# Patient Record
Sex: Male | Born: 1980 | Race: White | Hispanic: No | Marital: Married | Smoking: Current every day smoker
Health system: Southern US, Community
[De-identification: ages and names within clinical notes are randomized; demographics above are authoritative.]

---
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
## 2009-02-12 ENCOUNTER — Ambulatory Visit: Payer: Self-pay | Admitting: Diagnostic Radiology

## 2009-02-12 ENCOUNTER — Emergency Department (HOSPITAL_BASED_OUTPATIENT_CLINIC_OR_DEPARTMENT_OTHER): Admission: EM | Admit: 2009-02-12 | Discharge: 2009-02-12 | Payer: Self-pay | Admitting: Emergency Medicine

## 2009-02-14 ENCOUNTER — Emergency Department (HOSPITAL_BASED_OUTPATIENT_CLINIC_OR_DEPARTMENT_OTHER): Admission: EM | Admit: 2009-02-14 | Discharge: 2009-02-14 | Payer: Self-pay | Admitting: Emergency Medicine

## 2009-10-01 ENCOUNTER — Emergency Department (HOSPITAL_BASED_OUTPATIENT_CLINIC_OR_DEPARTMENT_OTHER): Admission: EM | Admit: 2009-10-01 | Discharge: 2009-10-01 | Payer: Self-pay | Admitting: Emergency Medicine

## 2010-06-14 LAB — MONONUCLEOSIS SCREEN: Mono Screen: NEGATIVE

## 2010-06-14 LAB — RAPID STREP SCREEN (MED CTR MEBANE ONLY): Streptococcus, Group A Screen (Direct): NEGATIVE

## 2010-11-18 ENCOUNTER — Emergency Department (HOSPITAL_BASED_OUTPATIENT_CLINIC_OR_DEPARTMENT_OTHER)
Admission: EM | Admit: 2010-11-18 | Discharge: 2010-11-18 | Disposition: A | Discharge status: Home / Self Care | Payer: BC Managed Care – PPO | Attending: Emergency Medicine | Admitting: Emergency Medicine

## 2010-11-18 ENCOUNTER — Emergency Department (INDEPENDENT_AMBULATORY_CARE_PROVIDER_SITE_OTHER): Payer: BC Managed Care – PPO

## 2010-11-18 DIAGNOSIS — IMO0002 Reserved for concepts with insufficient information to code with codable children: Secondary | ICD-10-CM

## 2010-11-18 DIAGNOSIS — R071 Chest pain on breathing: Secondary | ICD-10-CM | POA: Insufficient documentation

## 2010-11-18 DIAGNOSIS — R079 Chest pain, unspecified: Secondary | ICD-10-CM

## 2010-11-18 DIAGNOSIS — R0789 Other chest pain: Secondary | ICD-10-CM
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
## 2010-11-30 NOTE — ED Provider Notes (Signed)
Evaluation and management procedures were performed by the PA/NP under my supervision/collaboration.   Dione Booze, MD 11/30/10 [phone]

## 2019-06-09 ENCOUNTER — Emergency Department (HOSPITAL_COMMUNITY): Payer: Medicaid Other

## 2019-06-09 ENCOUNTER — Encounter (HOSPITAL_COMMUNITY): Payer: Self-pay

## 2019-06-09 ENCOUNTER — Other Ambulatory Visit: Payer: Self-pay

## 2019-06-09 ENCOUNTER — Inpatient Hospital Stay (HOSPITAL_COMMUNITY)
Admission: EM | Admit: 2019-06-09 | Discharge: 2019-06-14 | DRG: 872 | Disposition: A | Discharge status: Home / Self Care | Payer: Medicaid Other | Attending: Urology | Admitting: Urology

## 2019-06-09 DIAGNOSIS — Z823 Family history of stroke: Secondary | ICD-10-CM

## 2019-06-09 DIAGNOSIS — E669 Obesity, unspecified: Secondary | ICD-10-CM | POA: Diagnosis present

## 2019-06-09 DIAGNOSIS — Z882 Allergy status to sulfonamides status: Secondary | ICD-10-CM

## 2019-06-09 DIAGNOSIS — E876 Hypokalemia: Secondary | ICD-10-CM

## 2019-06-09 DIAGNOSIS — R200 Anesthesia of skin: Secondary | ICD-10-CM | POA: Diagnosis present

## 2019-06-09 DIAGNOSIS — Z880 Allergy status to penicillin: Secondary | ICD-10-CM

## 2019-06-09 DIAGNOSIS — Z6832 Body mass index (BMI) 32.0-32.9, adult: Secondary | ICD-10-CM

## 2019-06-09 DIAGNOSIS — Z833 Family history of diabetes mellitus: Secondary | ICD-10-CM

## 2019-06-09 DIAGNOSIS — Z8249 Family history of ischemic heart disease and other diseases of the circulatory system: Secondary | ICD-10-CM

## 2019-06-09 DIAGNOSIS — Z20822 Contact with and (suspected) exposure to covid-19: Secondary | ICD-10-CM | POA: Diagnosis present

## 2019-06-09 DIAGNOSIS — Z720 Tobacco use: Secondary | ICD-10-CM | POA: Diagnosis present

## 2019-06-09 DIAGNOSIS — F17200 Nicotine dependence, unspecified, uncomplicated: Secondary | ICD-10-CM | POA: Diagnosis present

## 2019-06-09 DIAGNOSIS — N492 Inflammatory disorders of scrotum: Secondary | ICD-10-CM | POA: Diagnosis present

## 2019-06-09 DIAGNOSIS — E1165 Type 2 diabetes mellitus with hyperglycemia: Secondary | ICD-10-CM | POA: Diagnosis present

## 2019-06-09 DIAGNOSIS — A4102 Sepsis due to Methicillin resistant Staphylococcus aureus: Principal | ICD-10-CM | POA: Diagnosis present

## 2019-06-09 LAB — COMPREHENSIVE METABOLIC PANEL
ALT: 33 U/L (ref 0–44)
AST: 24 U/L (ref 15–41)
Albumin: 3.6 g/dL (ref 3.5–5.0)
Alkaline Phosphatase: 99 U/L (ref 38–126)
Anion gap: 16 — ABNORMAL HIGH (ref 5–15)
BUN: 11 mg/dL (ref 6–20)
CO2: 20 mmol/L — ABNORMAL LOW (ref 22–32)
Calcium: 8.7 mg/dL — ABNORMAL LOW (ref 8.9–10.3)
Chloride: 98 mmol/L (ref 98–111)
Creatinine, Ser: 0.89 mg/dL (ref 0.61–1.24)
GFR calc Af Amer: >60 mL/min (ref >60)
GFR calc non Af Amer: >60 mL/min (ref >60)
Glucose, Bld: 433 mg/dL — ABNORMAL HIGH (ref 70–99)
Potassium: 3.7 mmol/L (ref 3.5–5.1)
Sodium: 134 mmol/L — ABNORMAL LOW (ref 135–145)
Total Bilirubin: 1.7 mg/dL — ABNORMAL HIGH (ref 0.3–1.2)
Total Protein: 7.7 g/dL (ref 6.5–8.1)

## 2019-06-09 LAB — CBC WITH DIFFERENTIAL/PLATELET
Abs Immature Granulocytes: 0.26 10*3/uL — ABNORMAL HIGH (ref 0.00–0.07)
Basophils Absolute: 0.1 10*3/uL (ref 0.0–0.1)
Basophils Relative: 0 %
Eosinophils Absolute: 0.1 10*3/uL (ref 0.0–0.5)
Eosinophils Relative: 0 %
HCT: 46.4 % (ref 39.0–52.0)
Hemoglobin: 15.6 g/dL (ref 13.0–17.0)
Immature Granulocytes: 1 %
Lymphocytes Relative: 5 %
Lymphs Abs: 1.1 10*3/uL (ref 0.7–4.0)
MCH: 29.6 pg (ref 26.0–34.0)
MCHC: 33.6 g/dL (ref 30.0–36.0)
MCV: 88 fL (ref 80.0–100.0)
Monocytes Absolute: 1.8 10*3/uL — ABNORMAL HIGH (ref 0.1–1.0)
Monocytes Relative: 8 %
Neutro Abs: 19.3 10*3/uL — ABNORMAL HIGH (ref 1.7–7.7)
Neutrophils Relative %: 86 %
Platelets: 234 10*3/uL (ref 150–400)
RBC: 5.27 MIL/uL (ref 4.22–5.81)
RDW: 11.7 % (ref 11.5–15.5)
WBC: 22.7 10*3/uL — ABNORMAL HIGH (ref 4.0–10.5)
nRBC: 0 % (ref 0.0–0.2)

## 2019-06-09 LAB — RESPIRATORY PANEL BY RT PCR (FLU A&B, COVID)
Influenza A by PCR: NEGATIVE
Influenza B by PCR: NEGATIVE
SARS Coronavirus 2 by RT PCR: NEGATIVE

## 2019-06-09 LAB — URINALYSIS, ROUTINE W REFLEX MICROSCOPIC
Bacteria, UA: NONE SEEN
Bilirubin Urine: NEGATIVE
Glucose, UA: >=500 mg/dL — AB
Hgb urine dipstick: NEGATIVE
Ketones, ur: 80 mg/dL — AB
Leukocytes,Ua: NEGATIVE
Nitrite: NEGATIVE
Protein, ur: 30 mg/dL — AB
Specific Gravity, Urine: 1.038 — ABNORMAL HIGH (ref 1.005–1.030)
pH: 6 (ref 5.0–8.0)

## 2019-06-09 LAB — LACTIC ACID, PLASMA: Lactic Acid, Venous: 1.3 mmol/L (ref 0.5–1.9)

## 2019-06-09 MED ORDER — SODIUM CHLORIDE 0.9 % IV SOLN
1000.0000 mL | INTRAVENOUS | Status: DC
  Administered 2019-06-09 – 2019-06-10 (×2): 1000 mL via INTRAVENOUS

## 2019-06-09 MED ORDER — DIPHENHYDRAMINE HCL 12.5 MG/5ML PO ELIX
12.5000 mg | ORAL_SOLUTION | Freq: Four times a day (QID) | ORAL | Status: DC | PRN
  Administered 2019-06-09 – 2019-06-11 (×2): 25 mg via ORAL
  Filled 2019-06-09 (×2): qty 10

## 2019-06-09 MED ORDER — DEXTROSE-NACL 5-0.45 % IV SOLN: INTRAVENOUS | Status: DC

## 2019-06-09 MED ORDER — CLINDAMYCIN PHOSPHATE 900 MG/50ML IV SOLN
900.0000 mg | Freq: Three times a day (TID) | INTRAVENOUS | Status: DC
  Administered 2019-06-09 – 2019-06-11 (×6): 900 mg via INTRAVENOUS
  Filled 2019-06-09 (×9): qty 50

## 2019-06-09 MED ORDER — SODIUM CHLORIDE (PF) 0.9 % IJ SOLN
INTRAMUSCULAR | Status: AC
  Filled 2019-06-09: qty 50

## 2019-06-09 MED ORDER — IOHEXOL 300 MG/ML  SOLN
100.0000 mL | Freq: Once | INTRAMUSCULAR | Status: AC | PRN
  Administered 2019-06-09: 100 mL via INTRAVENOUS

## 2019-06-09 MED ORDER — OXYCODONE HCL 5 MG PO TABS
5.0000 mg | ORAL_TABLET | ORAL | Status: DC | PRN
  Administered 2019-06-12 – 2019-06-14 (×6): 5 mg via ORAL
  Filled 2019-06-09 (×6): qty 1

## 2019-06-09 MED ORDER — SODIUM CHLORIDE 0.9 % IV BOLUS (SEPSIS)
1000.0000 mL | Freq: Once | INTRAVENOUS | Status: AC
  Administered 2019-06-09: 11:00:00 1000 mL via INTRAVENOUS

## 2019-06-09 MED ORDER — MORPHINE SULFATE (PF) 2 MG/ML IV SOLN
2.0000 mg | Freq: Once | INTRAVENOUS | Status: AC
  Administered 2019-06-09: 2 mg via INTRAVENOUS
  Filled 2019-06-09: qty 1

## 2019-06-09 MED ORDER — ACETAMINOPHEN 325 MG PO TABS
650.0000 mg | ORAL_TABLET | Freq: Once | ORAL | Status: AC
  Administered 2019-06-09: 650 mg via ORAL
  Filled 2019-06-09: qty 2

## 2019-06-09 MED ORDER — SODIUM CHLORIDE 0.9% FLUSH
3.0000 mL | Freq: Once | INTRAVENOUS | Status: AC
  Administered 2019-06-09: 3 mL via INTRAVENOUS

## 2019-06-09 MED ORDER — ACETAMINOPHEN 500 MG PO TABS
1000.0000 mg | ORAL_TABLET | Freq: Once | ORAL | Status: AC
  Administered 2019-06-09: 1000 mg via ORAL
  Filled 2019-06-09: qty 2

## 2019-06-09 MED ORDER — SODIUM CHLORIDE 0.9 % IV BOLUS
1000.0000 mL | Freq: Once | INTRAVENOUS | Status: AC
  Administered 2019-06-09: 1000 mL via INTRAVENOUS

## 2019-06-09 MED ORDER — METRONIDAZOLE IN NACL 5-0.79 MG/ML-% IV SOLN
500.0000 mg | Freq: Once | INTRAVENOUS | Status: AC
  Administered 2019-06-09: 13:00:00 500 mg via INTRAVENOUS
  Filled 2019-06-09: qty 100

## 2019-06-09 MED ORDER — DOCUSATE SODIUM 100 MG PO CAPS
100.0000 mg | ORAL_CAPSULE | Freq: Two times a day (BID) | ORAL | Status: DC
  Administered 2019-06-10 – 2019-06-14 (×4): 100 mg via ORAL
  Filled 2019-06-09 (×9): qty 1

## 2019-06-09 MED ORDER — VANCOMYCIN HCL 1250 MG/250ML IV SOLN
1250.0000 mg | Freq: Two times a day (BID) | INTRAVENOUS | Status: DC
  Administered 2019-06-10 – 2019-06-12 (×5): 1250 mg via INTRAVENOUS
  Filled 2019-06-09 (×5): qty 250

## 2019-06-09 MED ORDER — DIPHENHYDRAMINE HCL 50 MG/ML IJ SOLN: 12.5000 mg | Freq: Four times a day (QID) | INTRAMUSCULAR | Status: DC | PRN

## 2019-06-09 MED ORDER — ONDANSETRON HCL 4 MG/2ML IJ SOLN: 4.0000 mg | INTRAMUSCULAR | Status: DC | PRN

## 2019-06-09 MED ORDER — MORPHINE SULFATE (PF) 2 MG/ML IV SOLN
2.0000 mg | INTRAVENOUS | Status: DC | PRN
  Administered 2019-06-12 – 2019-06-13 (×2): 2 mg via INTRAVENOUS
  Filled 2019-06-09 (×2): qty 1

## 2019-06-09 MED ORDER — SODIUM CHLORIDE 0.9 % IV SOLN
2.0000 g | Freq: Once | INTRAVENOUS | Status: AC
  Administered 2019-06-09: 12:00:00 2 g via INTRAVENOUS
  Filled 2019-06-09: qty 2

## 2019-06-09 MED ORDER — VANCOMYCIN HCL 2000 MG/400ML IV SOLN
2000.0000 mg | Freq: Once | INTRAVENOUS | Status: AC
  Administered 2019-06-09: 2000 mg via INTRAVENOUS
  Filled 2019-06-09: qty 400

## 2019-06-09 MED ORDER — LIDOCAINE HCL 1 % IJ SOLN
INTRAMUSCULAR | Status: AC
  Filled 2019-06-09: qty 20

## 2019-06-09 MED ORDER — SODIUM CHLORIDE 0.9 % IV BOLUS (SEPSIS)
1000.0000 mL | Freq: Once | INTRAVENOUS | Status: AC
  Administered 2019-06-09: 12:00:00 1000 mL via INTRAVENOUS

## 2019-06-09 MED ORDER — SODIUM CHLORIDE 0.9 % IV BOLUS (SEPSIS)
1000.0000 mL | Freq: Once | INTRAVENOUS | Status: AC
  Administered 2019-06-09: 1000 mL via INTRAVENOUS

## 2019-06-09 MED ORDER — VANCOMYCIN HCL IN DEXTROSE 1-5 GM/200ML-% IV SOLN
1000.0000 mg | Freq: Once | INTRAVENOUS | Status: DC
  Filled 2019-06-09: qty 200

## 2019-06-09 NOTE — ED Provider Notes (Signed)
[facility]-EMERGENCY DEPT Provider Note   CSN: [id number] Arrival date & time: 06/09/19  4128     History Chief Complaint  Patient presents with   Recurrent Skin Infections    Jordan Black is a 39 y.o. male.  39 y.o male with no PMH presents to the ED with a chief complaint of right groin pain x 2 days. Patient reports right groin pain began on Saturday, it has now extended onto his pelvic region, there is redness noted to the area, there is pain with palpation along with ambulation.  He has been applying heat to the area which has improved somewhat of his symptoms.  Patient reports wound is now actively draining.  Has not had an abscess to the groin area in the past.  Of note, does report feeling chills over the weekend but did not measure his temperature.  No fever, urinary symptoms, lower abdominal pain.  The history is provided by the patient.       History reviewed. No pertinent past medical history.  There are no problems to display for this patient.   No past surgical history on file.     No family history on file.  Social History   Tobacco Use   Smoking status: Passive Smoke Exposure - Never Smoker  Substance Use Topics   Alcohol use: No    Comment: occ   Drug use: No    Home Medications Prior to Admission medications   Not on File    Allergies    Sulfa antibiotics and Penicillins  Review of Systems   Review of Systems  Constitutional: Negative for fever.  Respiratory: Negative for shortness of breath.   Gastrointestinal: Negative for abdominal pain, nausea and vomiting.  Genitourinary: Positive for scrotal swelling. Negative for flank pain and urgency.  Musculoskeletal: Negative for back pain.  All other systems reviewed and are negative.   Physical Exam Updated Vital Signs BP (!) 139/92    Pulse (!) 121    Temp 98.2 F (36.8 C) (Oral)    Resp 17    Ht 5\' 9"  (1.753 m)    Wt 99.8 kg    SpO2 97%    BMI 32.49 kg/m    Physical Exam Vitals and nursing note reviewed. Exam conducted with a chaperone present.  Constitutional:      Appearance: Normal appearance. He is not ill-appearing.  HENT:     Head: Normocephalic and atraumatic.     Nose: Nose normal.     Mouth/Throat:     Mouth: Mucous membranes are moist.  Eyes:     Pupils: Pupils are equal, round, and reactive to light.  Cardiovascular:     Rate and Rhythm: Tachycardia present.  Pulmonary:     Effort: Pulmonary effort is normal.     Breath sounds: No wheezing or rales.  Abdominal:     General: Abdomen is flat.     Tenderness: There is no abdominal tenderness. There is no right CVA tenderness or left CVA tenderness.     Hernia: There is no hernia in the right inguinal area.  Genitourinary:    Penis: Normal and circumcised.      Testes:        Right: Tenderness and swelling present. Testicular hydrocele not present.        Left: Tenderness, swelling or testicular hydrocele not present. Left testis is descended.     Epididymis:     Right: Inflamed. Tenderness present.     Comments:  Chaperoned by Daphine Deutscher NT, erythema, edema, extending from lower right testicle to the upper pelvis, induration noted along with active drainage from the wound.  Musculoskeletal:     Cervical back: Normal range of motion and neck supple.  Skin:    General: Skin is warm and dry.     Findings: Erythema present.  Neurological:     Mental Status: He is alert and oriented to person, place, and time.     ED Results / Procedures / Treatments   Labs (all labs ordered are listed, but only abnormal results are displayed) Labs Reviewed  COMPREHENSIVE METABOLIC PANEL - Abnormal; Notable for the following components:      Result Value   Sodium 134 (*)    CO2 20 (*)    Glucose, Bld 433 (*)    Calcium 8.7 (*)    Total Bilirubin 1.7 (*)    Anion gap 16 (*)    All other components within normal limits  CBC WITH DIFFERENTIAL/PLATELET - Abnormal; Notable for the  following components:   WBC 22.7 (*)    Neutro Abs 19.3 (*)    Monocytes Absolute 1.8 (*)    Abs Immature Granulocytes 0.26 (*)    All other components within normal limits  URINALYSIS, ROUTINE W REFLEX MICROSCOPIC - Abnormal; Notable for the following components:   Specific Gravity, Urine 1.038 (*)    Glucose, UA >=500 (*)    Ketones, ur 80 (*)    Protein, ur 30 (*)    All other components within normal limits  CULTURE, BLOOD (ROUTINE X 2)  CULTURE, BLOOD (ROUTINE X 2)  RESPIRATORY PANEL BY RT PCR (FLU A&B, COVID)  LACTIC ACID, PLASMA    EKG None  Radiology CT PELVIS W CONTRAST  Result Date: 06/09/2019 CLINICAL DATA:  Abscess near genitals, skin infection, abscess of anal and rectal regions EXAM: CT PELVIS WITH CONTRAST TECHNIQUE: Multidetector CT imaging of the pelvis was performed using the standard protocol following the bolus administration of intravenous contrast. CONTRAST:  OMNIPAQUE IOHEXOL 300 MG/ML  SOLN COMPARISON:  None. FINDINGS: Urinary Tract:  No abnormality visualized. Bowel:  Unremarkable visualized pelvic bowel loops. Vascular/Lymphatic: Prominent right inguinal lymph nodes. No significant vascular abnormality seen. Reproductive:  No mass or other significant abnormality Other: Extensive soft tissue edema about the right inguinal canal, scrotum, and base of the penis. There is edematous fluid within the right aspect of the scrotal wall without discretely circumscribed fluid collection appreciated. Musculoskeletal: No suspicious bone lesions identified. IMPRESSION: 1. Extensive soft tissue edema about the right inguinal canal, scrotum, and base of the penis. There is edematous fluid within the right aspect of the scrotal wall without discretely circumscribed fluid collection appreciated or evidence of gas-forming infection at this time, however this general constellation of findings is concerning for Fournier's gangrene. 2. No evidence of anorectal for intrapelvic  perirectal infection or abscess. Electronically Signed   By: Lauralyn Primes M.D.   On: 06/09/2019 14:58    Procedures Procedures (including critical care time)  Medications Ordered in ED Medications  0.9 %  sodium chloride infusion (1,000 mLs Intravenous New Bag/Given 06/09/19 1105)  vancomycin (VANCOREADY) IVPB 2000 mg/400 mL (2,000 mg Intravenous New Bag/Given 06/09/19 1438)  sodium chloride (PF) 0.9 % injection (  Canceled Entry 06/09/19 1423)  lidocaine (XYLOCAINE) 1 % (with pres) injection (has no administration in time range)  sodium chloride flush (NS) 0.9 % injection 3 mL (3 mLs Intravenous Given by Other 06/09/19 0942)  acetaminophen (TYLENOL) tablet  1,000 mg (1,000 mg Oral Given 06/09/19 1040)  sodium chloride 0.9 % bolus 1,000 mL (0 mLs Intravenous Stopped 06/09/19 1206)    And  sodium chloride 0.9 % bolus 1,000 mL (0 mLs Intravenous Stopped 06/09/19 1206)    And  sodium chloride 0.9 % bolus 1,000 mL (0 mLs Intravenous Stopped 06/09/19 1400)  aztreonam (AZACTAM) 2 g in sodium chloride 0.9 % 100 mL IVPB (0 g Intravenous Stopped 06/09/19 1400)  metroNIDAZOLE (FLAGYL) IVPB 500 mg (500 mg Intravenous New Bag/Given 06/09/19 1249)  iohexol (OMNIPAQUE) 300 MG/ML solution 100 mL (100 mLs Intravenous Contrast Given 06/09/19 1419)  morphine 2 MG/ML injection 2 mg (2 mg Intravenous Given 06/09/19 1523)    ED Course  I have reviewed the triage vital signs and the nursing notes.  Pertinent labs & imaging results that were available during my care of the patient were reviewed by me and considered in my medical decision making (see chart for details).  Clinical Course as of Jun 08 1528  Mon Jun 09, 2019  1025 Temp(!): 101.7 F (38.7 C) [JS]  1425 WBC(!): 22.7 [JS]  1446 No prior history of diabetes per patient  Glucose(!): 433 [JS]  1447 Ketones, ur(!): 80 [JS]    Clinical Course User Index [JS] Claude Manges, PA-C   MDM Rules/Calculators/A&P  Patient with no pertinent past medical history  presents to the ED with a chief complaint of right groin swelling which began 2 days ago.  He reports applying heat to the area with mild improvement in his symptoms.  There is pain with ambulation, tenderness to palpation.  There is changes in the skin consistent with induration and fluctuance present.  Has not had a abscess to the right groin in the past.  Epididymitis is also tender along with inflamed.  Some suspicion for scrotal abscess versus fournier's gangrene.  During primary evaluation patient is febrile, tachycardic, denies any prior history of cellulitis. Given 1000 tylenol to help with his symptoms. Sepsis protocol has been started with Tachycardia, fever and leukocytosis of 22.  Did rotation of his labs reveal a CBC of 22.7, that is vertical has been started, blood cultures were obtained along with lactic acid. Which did not result due to lab error. CMP with elevated glucose at 433, he denies any prior history of diabetes. Anion gap is slightly increase along with a decrease in CO2. UA with ketones, some suspicion for new onset diabetes with DKA.  He does report recurrent skin infections, but states he has never been tested for diabetes in the past.  Blood cultures were also obtained.  Broad-spectrum antibiotics have been given.  Patient is pending CT pelvis.  2:48 PM Call has been placed for Urology for their recommendations. Spoke to Dr. Arita Miss who agreed to review images and will return callback.   CT Pelvis with contrast showed: 1. Extensive soft tissue edema about the right inguinal canal,  scrotum, and base of the penis. There is edematous fluid within the  right aspect of the scrotal wall without discretely circumscribed  fluid collection appreciated or evidence of gas-forming infection at  this time, however this general constellation of findings is  concerning for Fournier's gangrene.   2. No evidence of anorectal for intrapelvic perirectal infection or  abscess.   Of note, CT  pelvis was ordered during my first evaluation of patient but due to CT patient load scan was delayed 5 hours. Rapid covid test has been ordered.   3:30 PM Dr. Roselee Nova pace, of  urology is at the bedside, performing I&D.  She has agreed to admit patient at this time.  Portions of this note were generated with Lobbyist. Dictation errors may occur despite best attempts at proofreading.  Final Clinical Impression(s) / ED Diagnoses Final diagnoses:  Scrotal abscess    Rx / DC Orders ED Discharge Orders    None       Janeece Fitting, Hershal Coria 06/09/19 1530    Lacretia Leigh, MD 06/10/19 [phone]

## 2019-06-09 NOTE — Progress Notes (Signed)
Pharmacy Antibiotic Note  Jordan Black is a 39 y.o. male admitted on 06/09/2019 with cellulitis.  Pharmacy has been consulted for vancomycin dosing.  Pt presented to ED with cellulitis of right groin. CT pelvis shows inflammation/infection in right scrotum and inguinal area. I&D performed on 06/09/19. Patient prescribed vancomycin + clindamycin.  Today, 06/09/19  WBC 22.7  SCr 0.89, CrCl > 60 mL/min  Tmax 101.7 F  Plan:  Pt received vancomycin 2000 mg loading dose. Will order vancomycin 1250 mg IV q12h maintenance dose  Goal vancomycin AUC 400-550  Follow renal function, culture data. Check levels once at steady state if indicated  Height: 5\' 9"  (175.3 cm) Weight: 220 lb (99.8 kg) IBW/kg (Calculated) : 70.7  Temp (24hrs), Avg:100.4 F (38 C), Min:98.2 F (36.8 C), Max:101.7 F (38.7 C)  Recent Labs  Lab 06/09/19 0942 06/09/19 1347  WBC 22.7*  --   CREATININE 0.89  --   LATICACIDVEN  --  1.3    Estimated Creatinine Clearance: 133.6 mL/min (by C-G formula based on SCr of 0.89 mg/dL).    Allergies  Allergen Reactions  . Sulfa Antibiotics Hives  . Penicillins Hives and Rash    "Given while in the hospital and turned red all over" Did it involve swelling of the face/tongue/throat, SOB, or low BP? Unknown Did it involve sudden or severe rash/hives, skin peeling, or any reaction on the inside of your mouth or nose? No Did you need to seek medical attention at a hospital or doctor's office? No When did it last happen?As a child If all above answers are "NO", may proceed with cephalosporin use.     Antimicrobials this admission: vancomycin 3/29 >>  Clindamycin 3/29 >> metronidazole 3/29 x1 dose Aztreonam 3/29 x1 dose  Dose adjustments this admission: n/a  Microbiology results: 3/29 BCx: ngtd 3/29 specimen culture: Sent  Thank you for allowing pharmacy to be a part of this patient's care.  4/29, PharmD 06/09/2019 4:21 PM

## 2019-06-09 NOTE — ED Notes (Signed)
Contacted lab about lactic not resulting. Lab does not have order for one collected at 1015, so they did not run it.

## 2019-06-09 NOTE — ED Notes (Signed)
Urology draining abscess at bedside.

## 2019-06-09 NOTE — H&P (Signed)
H&P Physician requesting consult: Janeece Fitting, [place]  Chief Complaint: cellulitis/abscess right groin  History of Present Illness: 39 yo man with no significant past medical history presents with 2 days of worsening right groin pain associated with redness and swelling.  He states this has happened in the past when he a boil.  CT pelvis shows inflammation/infection tracking from right scrotum up to base of penis and right inguinal region.  WBC 22.  No fever, chills, nausea or vomiting.   The patient said it started spontaneously draining while he was in the ED.    History reviewed. No pertinent past medical history. No past surgical history on file.  Home Medications:  (Not in a hospital admission)  Allergies:  Allergies  Allergen Reactions  . Sulfa Antibiotics Hives  . Penicillins Hives and Rash    "Given while in the hospital and turned red all over" Did it involve swelling of the face/tongue/throat, SOB, or low BP? Unknown Did it involve sudden or severe rash/hives, skin peeling, or any reaction on the inside of your mouth or nose? No Did you need to seek medical attention at a hospital or doctor's office? No When did it last happen?As a child If all above answers are "NO", may proceed with cephalosporin use.     No family history on file. Social History:  reports that he is a non-smoker but has been exposed to tobacco smoke. He does not have any smokeless tobacco history on file. He reports that he does not drink alcohol or use drugs.  ROS: A complete review of systems was performed.  All systems are negative except for pertinent findings as noted. ROS   Physical Exam:  Vital signs in last 24 hours: Temp:  [98.2 F (36.8 C)-101.7 F (38.7 C)] 98.2 F (36.8 C) (03/29 1205) Pulse Rate:  [109-125] 120 (03/29 1545) Resp:  [16-18] 16 (03/29 1545) BP: (130-144)/(76-92) 141/76 (03/29 1545) SpO2:  [95 %-99 %] 99 % (03/29 1545) Weight:  [99.8 kg] 99.8 kg (03/29  0931) General:  Alert and oriented, No acute distress HEENT: Normocephalic, atraumatic Neck: No JVD or lymphadenopathy Cardiovascular: Regular rate and rhythm Lungs: Regular rate and effort Abdomen: Soft, nontender, erythema on right pelvic/inguinal region consisten with cellulitis GU: circ phallus, edematous/erythematous right hemiscrotum with area of fluctuance and drainage on inferior area; no crepitus Back: No CVA tenderness Extremities: No edema Neurologic: Grossly intact  Laboratory Data:  Results for orders placed or performed during the hospital encounter of 06/09/19 (from the past 24 hour(s))  Comprehensive metabolic panel     Status: Abnormal   Collection Time: 06/09/19  9:42 AM  Result Value Ref Range   Sodium 134 (L) 135 - 145 mmol/L   Potassium 3.7 3.5 - 5.1 mmol/L   Chloride 98 98 - 111 mmol/L   CO2 20 (L) 22 - 32 mmol/L   Glucose, Bld 433 (H) 70 - 99 mg/dL   BUN 11 6 - 20 mg/dL   Creatinine, Ser 0.89 0.61 - 1.24 mg/dL   Calcium 8.7 (L) 8.9 - 10.3 mg/dL   Total Protein 7.7 6.5 - 8.1 g/dL   Albumin 3.6 3.5 - 5.0 g/dL   AST 24 15 - 41 U/L   ALT 33 0 - 44 U/L   Alkaline Phosphatase 99 38 - 126 U/L   Total Bilirubin 1.7 (H) 0.3 - 1.2 mg/dL   GFR calc non Af Amer >60 >60 mL/min   GFR calc Af Amer >60 >60 mL/min   Anion gap  16 (H) 5 - 15  CBC with Differential     Status: Abnormal   Collection Time: 06/09/19  9:42 AM  Result Value Ref Range   WBC 22.7 (H) 4.0 - 10.5 K/uL   RBC 5.27 4.22 - 5.81 MIL/uL   Hemoglobin 15.6 13.0 - 17.0 g/dL   HCT 71.6 96.7 - 89.3 %   MCV 88.0 80.0 - 100.0 fL   MCH 29.6 26.0 - 34.0 pg   MCHC 33.6 30.0 - 36.0 g/dL   RDW 81.0 17.5 - 10.2 %   Platelets 234 150 - 400 K/uL   nRBC 0.0 0.0 - 0.2 %   Neutrophils Relative % 86 %   Neutro Abs 19.3 (H) 1.7 - 7.7 K/uL   Lymphocytes Relative 5 %   Lymphs Abs 1.1 0.7 - 4.0 K/uL   Monocytes Relative 8 %   Monocytes Absolute 1.8 (H) 0.1 - 1.0 K/uL   Eosinophils Relative 0 %   Eosinophils  Absolute 0.1 0.0 - 0.5 K/uL   Basophils Relative 0 %   Basophils Absolute 0.1 0.0 - 0.1 K/uL   Immature Granulocytes 1 %   Abs Immature Granulocytes 0.26 (H) 0.00 - 0.07 K/uL  Urinalysis, Routine w reflex microscopic     Status: Abnormal   Collection Time: 06/09/19  9:42 AM  Result Value Ref Range   Color, Urine YELLOW YELLOW   APPearance CLEAR CLEAR   Specific Gravity, Urine 1.038 (H) 1.005 - 1.030   pH 6.0 5.0 - 8.0   Glucose, UA >=500 (A) NEGATIVE mg/dL   Hgb urine dipstick NEGATIVE NEGATIVE   Bilirubin Urine NEGATIVE NEGATIVE   Ketones, ur 80 (A) NEGATIVE mg/dL   Protein, ur 30 (A) NEGATIVE mg/dL   Nitrite NEGATIVE NEGATIVE   Leukocytes,Ua NEGATIVE NEGATIVE   RBC / HPF 0-5 0 - 5 RBC/hpf   WBC, UA 0-5 0 - 5 WBC/hpf   Bacteria, UA NONE SEEN NONE SEEN  Lactic acid, plasma     Status: None   Collection Time: 06/09/19  1:47 PM  Result Value Ref Range   Lactic Acid, Venous 1.3 0.5 - 1.9 mmol/L   No results found for this or any previous visit (from the past 240 hour(s)). Creatinine: Recent Labs    06/09/19 0942  CREATININE 0.89   CT Pelvis CLINICAL DATA:  Abscess near genitals, skin infection, abscess of anal and rectal regions  EXAM: CT PELVIS WITH CONTRAST  TECHNIQUE: Multidetector CT imaging of the pelvis was performed using the standard protocol following the bolus administration of intravenous contrast.  CONTRAST:  OMNIPAQUE IOHEXOL 300 MG/ML  SOLN  COMPARISON:  None.  FINDINGS: Urinary Tract:  No abnormality visualized.  Bowel:  Unremarkable visualized pelvic bowel loops.  Vascular/Lymphatic: Prominent right inguinal lymph nodes. No significant vascular abnormality seen.  Reproductive:  No mass or other significant abnormality  Other: Extensive soft tissue edema about the right inguinal canal, scrotum, and base of the penis. There is edematous fluid within the right aspect of the scrotal wall without discretely circumscribed fluid  collection appreciated.  Musculoskeletal: No suspicious bone lesions identified.  IMPRESSION: 1. Extensive soft tissue edema about the right inguinal canal, scrotum, and base of the penis. There is edematous fluid within the right aspect of the scrotal wall without discretely circumscribed fluid collection appreciated or evidence of gas-forming infection at this time, however this general constellation of findings is concerning for Fournier's gangrene.  2. No evidence of anorectal for intrapelvic perirectal infection or abscess.  Incision  and drainage of scrotal abscess Risks and benefits of I&D of scrotum were discussed with the patient.  He was offered I&D in the OR or bedside. Patient elected bedside drainage.  He was given 2mg  IV morphine prior to starting.  Patient also received antibiotics in ED prior to procedure.   Surgeon: , MD  Pre-procedure diagnosis: right scrotal abscess  Post-procedure diagnosis: same  Indication: 39 yo man with right scrotal abscess and cellulitis.  CT concerning for progressing infection.  Procedure in detail: Patient was prepped and draped in usual sterile fashion.  1% lidocaine was injected in the right inferior scrotum.  Next, a scalpel was used to incise over the area of fluctuance.  Immediate drainage of purulent and bloody fluid was visible.  A culture swab was obtained.  Blunt dissection was used to break up any loculations.  The cavity was irrigated with normal saline and packed with sterile 4x4.  The patient tolerated the procedure well.    Assessment:  39 yo man with scrotal abscess and cellulitis s/p I&D.  -admit to Urology given leukocytosis and concern for developing fourniers on CT scan -continue broad spectrum antibiotics -AM CBC -pain meds PRN -SCDs while in bed -gen diet -daily packing changes -follow up wound culture   Lang Zingg D Inger Wiest 06/09/2019, 3:58 PM

## 2019-06-09 NOTE — Progress Notes (Signed)
A consult was received from an ED provider for Vancomcyin per pharmacy dosing. Pharmacist also asked to investigate beta-lactam allergy. If history of intolerance, mild allergy, or documented history of use of cephalosporins, pharmacy can adjust aztreonam to cefepime. The patient's profile has been reviewed for ht/wt/allergies/indication/available labs.    A one time order has been placed for Vancomycin 2g IV.  Per discussion with patient, he had a reaction to penicillin when he was a baby. He reports his body turned red. He is unable to recall if there was any shortness of breath, face/tongue/throat swelling, or if it required medical attention. Patient is unable to recall if he has taken any cephalosporins in the past. No record of cephalosporin use at [facility]. Therefore, will continue with Aztreonam 2g IV as ordered by ED provider (discussed with PA-C).  Further antibiotics/pharmacy consults should be ordered by admitting physician if indicated.                       Thank you, Jamse Mead 06/09/2019  12:19 PM

## 2019-06-10 DIAGNOSIS — F17200 Nicotine dependence, unspecified, uncomplicated: Secondary | ICD-10-CM | POA: Diagnosis present

## 2019-06-10 DIAGNOSIS — N492 Inflammatory disorders of scrotum: Secondary | ICD-10-CM | POA: Diagnosis present

## 2019-06-10 DIAGNOSIS — Z8249 Family history of ischemic heart disease and other diseases of the circulatory system: Secondary | ICD-10-CM | POA: Diagnosis not present

## 2019-06-10 DIAGNOSIS — Z880 Allergy status to penicillin: Secondary | ICD-10-CM | POA: Diagnosis not present

## 2019-06-10 DIAGNOSIS — Z882 Allergy status to sulfonamides status: Secondary | ICD-10-CM | POA: Diagnosis not present

## 2019-06-10 DIAGNOSIS — Z833 Family history of diabetes mellitus: Secondary | ICD-10-CM | POA: Diagnosis not present

## 2019-06-10 DIAGNOSIS — E1165 Type 2 diabetes mellitus with hyperglycemia: Secondary | ICD-10-CM | POA: Diagnosis present

## 2019-06-10 DIAGNOSIS — Z823 Family history of stroke: Secondary | ICD-10-CM | POA: Diagnosis not present

## 2019-06-10 DIAGNOSIS — Z6832 Body mass index (BMI) 32.0-32.9, adult: Secondary | ICD-10-CM | POA: Diagnosis not present

## 2019-06-10 DIAGNOSIS — A4102 Sepsis due to Methicillin resistant Staphylococcus aureus: Secondary | ICD-10-CM | POA: Diagnosis present

## 2019-06-10 DIAGNOSIS — Z20822 Contact with and (suspected) exposure to covid-19: Secondary | ICD-10-CM | POA: Diagnosis present

## 2019-06-10 DIAGNOSIS — R200 Anesthesia of skin: Secondary | ICD-10-CM | POA: Diagnosis present

## 2019-06-10 DIAGNOSIS — E669 Obesity, unspecified: Secondary | ICD-10-CM | POA: Diagnosis present

## 2019-06-10 LAB — CBC
HCT: 41.5 % (ref 39.0–52.0)
Hemoglobin: 13.8 g/dL (ref 13.0–17.0)
MCH: 29.9 pg (ref 26.0–34.0)
MCHC: 33.3 g/dL (ref 30.0–36.0)
MCV: 90 fL (ref 80.0–100.0)
Platelets: 234 10*3/uL (ref 150–400)
RBC: 4.61 MIL/uL (ref 4.22–5.81)
RDW: 11.9 % (ref 11.5–15.5)
WBC: 25.2 10*3/uL — ABNORMAL HIGH (ref 4.0–10.5)
nRBC: 0 % (ref 0.0–0.2)

## 2019-06-10 LAB — BASIC METABOLIC PANEL WITH GFR
Anion gap: 14 (ref 5–15)
BUN: 7 mg/dL (ref 6–20)
CO2: 16 mmol/L — ABNORMAL LOW (ref 22–32)
Calcium: 8.3 mg/dL — ABNORMAL LOW (ref 8.9–10.3)
Chloride: 104 mmol/L (ref 98–111)
Creatinine, Ser: 0.77 mg/dL (ref 0.61–1.24)
GFR calc Af Amer: >60 mL/min
GFR calc non Af Amer: >60 mL/min
Glucose, Bld: 295 mg/dL — ABNORMAL HIGH (ref 70–99)
Potassium: 3.7 mmol/L (ref 3.5–5.1)
Sodium: 134 mmol/L — ABNORMAL LOW (ref 135–145)

## 2019-06-10 LAB — HIV ANTIBODY (ROUTINE TESTING W REFLEX): HIV Screen 4th Generation wRfx: NONREACTIVE

## 2019-06-10 LAB — GLUCOSE, CAPILLARY: Glucose-Capillary: 319 mg/dL — ABNORMAL HIGH (ref 70–99)

## 2019-06-10 LAB — LACTIC ACID, PLASMA: Lactic Acid, Venous: 1.1 mmol/L (ref 0.5–1.9)

## 2019-06-10 MED ORDER — SODIUM CHLORIDE 0.9 % IV SOLN
2.0000 g | Freq: Three times a day (TID) | INTRAVENOUS | Status: DC
  Administered 2019-06-10 – 2019-06-11 (×5): 2 g via INTRAVENOUS
  Filled 2019-06-10 (×7): qty 2

## 2019-06-10 MED ORDER — INSULIN ASPART 100 UNIT/ML ~~LOC~~ SOLN
0.0000 [IU] | Freq: Every day | SUBCUTANEOUS | Status: DC
  Administered 2019-06-10: 4 [IU] via SUBCUTANEOUS

## 2019-06-10 MED ORDER — INSULIN ASPART 100 UNIT/ML ~~LOC~~ SOLN: 0.0000 [IU] | Freq: Three times a day (TID) | SUBCUTANEOUS | Status: DC

## 2019-06-10 NOTE — Sepsis Progress Note (Signed)
LA drawn and resulted 1.3 at 1347 on 3/29. Code Sepsis called on 3/30 at 0533. Blood cx's drawn at 1023 on 3/29. Pt received Cleocin at 0020 and [id number]. Vanco at 0516. Azactam at [phone]. Pt received 4L of NS prior to code sepsis and has good bp since code sepsis called. Since LA was not drawn within 6 hrs of code sepsis order. We should repeat LA now.  Notified provider of need to order lactic acid.

## 2019-06-10 NOTE — Progress Notes (Signed)
Pharmacy Antibiotic Note  Jordan Black is a 39 y.o. male admitted on 06/09/2019 with cellulitis.  Pharmacy has been consulted for vancomycin dosing.  Pt presented to ED with cellulitis of right groin. CT pelvis shows inflammation/infection in right scrotum and inguinal area. I&D performed on 06/09/19. Patient prescribed vancomycin + clindamycin and azactam.  Today, 06/10/19  WBC 22.7  SCr 0.89, CrCl > 60 mL/min  Tmax 101.7 F  Plan:  Pt received vancomycin 2000 mg loading dose. Will order vancomycin 1250 mg IV q12h maintenance dose  Goal vancomycin AUC 400-550  Follow renal function, culture data. Check levels once at steady state if indicated  Azactam 2 Gm IV q8h  Height: 5\' 9"  (175.3 cm) Weight: 220 lb (99.8 kg) IBW/kg (Calculated) : 70.7  Temp (24hrs), Avg:99.7 F (37.6 C), Min:98.2 F (36.8 C), Max:101.7 F (38.7 C)  Recent Labs  Lab 06/09/19 0942 06/09/19 1347 06/10/19 0427  WBC 22.7*  --  25.2*  CREATININE 0.89  --  0.77  LATICACIDVEN  --  1.3  --     Estimated Creatinine Clearance: 148.6 mL/min (by C-G formula based on SCr of 0.77 mg/dL).    Allergies  Allergen Reactions  . Sulfa Antibiotics Hives  . Penicillins Hives and Rash    "Given while in the hospital and turned red all over" Did it involve swelling of the face/tongue/throat, SOB, or low BP? Unknown Did it involve sudden or severe rash/hives, skin peeling, or any reaction on the inside of your mouth or nose? No Did you need to seek medical attention at a hospital or doctor's office? No When did it last happen?As a child If all above answers are "NO", may proceed with cephalosporin use.     Antimicrobials this admission: vancomycin 3/29 >>  Clindamycin 3/29 >> metronidazole 3/29 x1 dose Aztreonam 3/29 >>  Dose adjustments this admission: n/a  Microbiology results: 3/29 BCx: ngtd 3/29 specimen culture: Sent  Thank you for allowing pharmacy to be a part of this patient's  care.  4/29, PharmD 06/10/2019 5:47 AM

## 2019-06-10 NOTE — Progress Notes (Signed)
Inpatient Diabetes Program Recommendations  AACE/ADA: New Consensus Statement on Inpatient Glycemic Control (2015)  Target Ranges:  Prepandial:   less than 140 mg/dL      Peak postprandial:   less than 180 mg/dL (1-2 hours)      Critically ill patients:  140 - 180 mg/dL   No results found for: GLUCAP, HGBA1C  Review of Glycemic Control Results for LUI, BELLIS (MRN [id number]) as of 06/10/2019 09:33  Ref. Range 06/09/2019 09:42 06/10/2019 04:27  Glucose Latest Ref Range: 70 - 99 mg/dL 227 (H) 737 (H)   Diabetes history: None  Current orders for Inpatient glycemic control: None  Inpatient Diabetes Program Recommendations:    Glucose trends significantly elevated on admission.   -  Consider getting an A1c level to assess glucose control over the last 2-3 months.  -  Also consider checking CBGs ACHS and ordering Novolog 0-15 units tid + hs scale.   Thanks,  Christena Deem RN, MSN, BC-ADM Inpatient Diabetes Coordinator Team Pager [phone] (8a-5p)

## 2019-06-10 NOTE — Progress Notes (Signed)
Urology Inpatient Progress Report  Scrotal abscess [N49.2]        Intv/Subj: Patient tachycardic with fever overnight.  Fever lower than prior to I&D however.  Patient is without complaint.  He says pain is improved and he feels less swollen.    Active Problems:   Scrotal abscess  Current Facility-Administered Medications  Medication Dose Route Frequency Provider Last Rate Last Admin  . 0.9 %  sodium chloride infusion  1,000 mL Intravenous Continuous Soto, [name], PA-C   Stopped at 06/09/19 1710  . aztreonam (AZACTAM) 2 g in sodium chloride 0.9 % 100 mL IVPB  2 g Intravenous Q8H [name], RPH 200 mL/hr at 06/10/19 0651 2 g at 06/10/19 0651  . clindamycin (CLEOCIN) IVPB 900 mg  900 mg Intravenous Q8H [name], MD 100 mL/hr at 06/10/19 0020 900 mg at 06/10/19 0020  . dextrose 5 %-0.45 % sodium chloride infusion   Intravenous Continuous [name], MD 75 mL/hr at 06/09/19 1716 New Bag at 06/09/19 1716  . diphenhydrAMINE (BENADRYL) injection 12.5-25 mg  12.5-25 mg Intravenous Q6H PRN [name], MD       Or  . diphenhydrAMINE (BENADRYL) 12.5 MG/5ML elixir 12.5-25 mg  12.5-25 mg Oral Q6H PRN [name], MD   25 mg at 06/09/19 2213  . docusate sodium (COLACE) capsule 100 mg  100 mg Oral BID [name], MD      . morphine 2 MG/ML injection 2-4 mg  2-4 mg Intravenous Q2H PRN [name], MD      . ondansetron (ZOFRAN) injection 4 mg  4 mg Intravenous Q4H PRN [name], MD      . oxyCODONE (Oxy IR/ROXICODONE) immediate release tablet 5 mg  5 mg Oral Q4H PRN [name], MD      . vancomycin (VANCOREADY) IVPB 1250 mg/250 mL  1,250 mg Intravenous Q12H [name], RPH 166.7 mL/hr at 06/10/19 0516 1,250 mg at 06/10/19 0516     Objective: Vital: Vitals:   06/09/19 1900 06/09/19 2046 06/10/19 0050 06/10/19 0506  BP: 135/79 129/73 (!) 143/80 137/81  Pulse: (!) 130 (!) 123 (!) 121 (!) 118  Resp: 18 20  20   Temp: 100 F (37.8  C) 99.1 F (37.3 C) 98.7 F (37.1 C) 98.8 F (37.1 C)  TempSrc:  Oral Oral Oral  SpO2: 95% 94% 98% 96%  Weight:      Height:       I/Os: I/O last 3 completed shifts: In: 1002.3 [I.V.:501.9; IV Piggyback:500.4] Out: 1650 [Urine:1650]  Physical Exam:  General: Patient is in no apparent distress Lungs: Normal respiratory effort, chest expands symmetrically. GI: The abdomen is soft and nontender without mass. GU: erythema has not extended beyond outline in suprapubic area, perineal induration feels moderately improved as dose right hemiscrotum; cellulitis still present, packing changed this AM Ext: lower extremities symmetric  Lab Results: Recent Labs    06/09/19 0942 06/10/19 0427  WBC 22.7* 25.2*  HGB 15.6 13.8  HCT 46.4 41.5   Recent Labs    06/09/19 0942 06/10/19 0427  NA 134* 134*  K 3.7 3.7  CL 98 104  CO2 20* 16*  GLUCOSE 433* 295*  BUN 11 7  CREATININE 0.89 0.77  CALCIUM 8.7* 8.3*   No results for input(s): LABPT, INR in the last 72 hours. No results for input(s): LABURIN in the last 72 hours. Results for orders placed or performed during the hospital encounter of 06/09/19  Respiratory Panel by RT PCR (Flu A&B, Covid) - Nasopharyngeal Swab     Status: None   Collection Time: 06/09/19  4:11 PM   Specimen: Nasopharyngeal Swab  Result Value Ref Range Status   SARS Coronavirus 2 by RT PCR NEGATIVE NEGATIVE Final    Comment: (NOTE) SARS-CoV-2 target nucleic acids are NOT DETECTED. The SARS-CoV-2 RNA is generally detectable in upper respiratoy specimens during the acute phase of infection. The lowest concentration of SARS-CoV-2 viral copies this assay can detect is 131 copies/mL. A negative result does not preclude SARS-Cov-2 infection and should not be used as the sole basis for treatment or other patient management decisions. A negative result may occur with  improper specimen collection/handling, submission of specimen other than nasopharyngeal swab,  presence of viral mutation(s) within the areas targeted by this assay, and inadequate number of viral copies (<131 copies/mL). A negative result must be combined with clinical observations, patient history, and epidemiological information. The expected result is Negative. Fact Sheet for Patients:  https://www.moore.com/ Fact Sheet for Healthcare Providers:  https://www.young.biz/ This test is not yet ap proved or cleared by the [place] FDA and  has been authorized for detection and/or diagnosis of SARS-CoV-2 by FDA under an Emergency Use Authorization (EUA). This EUA will remain  in effect (meaning this test can be used) for the duration of the COVID-19 declaration under Section 564(b)(1) of the Act, 21 U.S.C. section 360bbb-3(b)(1), unless the authorization is terminated or revoked sooner.    Influenza A by PCR NEGATIVE NEGATIVE Final   Influenza B by PCR NEGATIVE NEGATIVE Final    Comment: (NOTE) The Xpert Xpress SARS-CoV-2/FLU/RSV assay is intended as an aid in  the diagnosis of influenza from Nasopharyngeal swab specimens and  should not be used as a sole basis for treatment. Nasal washings and  aspirates are unacceptable for Xpert Xpress SARS-CoV-2/FLU/RSV  testing. Fact Sheet for Patients: https://www.moore.com/ Fact Sheet for Healthcare Providers: https://www.young.biz/ This test is not yet approved or cleared by the [place] FDA and  has been authorized for detection and/or diagnosis of SARS-CoV-2 by  FDA under an Emergency Use Authorization (EUA). This EUA will remain  in effect (meaning this test can be used) for the duration of the  Covid-19 declaration under Section 564(b)(1) of the Act, 21  U.S.C. section 360bbb-3(b)(1), unless the authorization is  terminated or revoked. Performed at [facility], [address] [place], [place] [address]   Aerobic Culture  (superficial specimen)     Status: None (Preliminary result)   Collection Time: 06/09/19  4:11 PM   Specimen: Abscess  Result Value Ref Range Status   Specimen Description   Final    ABSCESS Performed at [facility], [address], [place], [place] [address]    Special Requests   Final    NONE Performed at [facility], [address] [place], [place] [address]    Gram Stain   Final    ABUNDANT WBC PRESENT, PREDOMINANTLY PMN ABUNDANT GRAM POSITIVE COCCI Performed at [facility], [address], [place], [place] [address]    Culture PENDING  Incomplete   Report Status PENDING  Incomplete    Studies/Results: CT PELVIS W CONTRAST  Result Date: 06/09/2019 CLINICAL DATA:  Abscess near genitals, skin infection, abscess of anal and rectal regions EXAM: CT PELVIS WITH CONTRAST TECHNIQUE: Multidetector CT imaging of the pelvis was performed using the standard protocol following the bolus administration of intravenous contrast. CONTRAST:  138mL OMNIPAQUE IOHEXOL 300 MG/ML  SOLN COMPARISON:  None. FINDINGS: Urinary Tract:  No abnormality visualized. Bowel:  Unremarkable visualized pelvic bowel loops. Vascular/Lymphatic: Prominent right inguinal lymph nodes. No significant vascular abnormality seen. Reproductive:  No mass or other significant abnormality Other: Extensive soft tissue edema about the right inguinal canal, scrotum, and base of the penis. There is edematous fluid within the right aspect of the scrotal wall without discretely circumscribed fluid collection appreciated. Musculoskeletal: No suspicious bone lesions identified. IMPRESSION: 1. Extensive soft tissue edema about the right inguinal canal, scrotum, and base of the penis. There is edematous fluid within the right aspect of the scrotal wall without discretely circumscribed fluid collection appreciated or evidence of gas-forming infection at this time, however this general constellation of  findings is concerning for Fournier's gangrene. 2. No evidence of anorectal for intrapelvic perirectal infection or abscess. Electronically Signed   By: Eddie Candle M.D.   On: 06/09/2019 14:58    Assessment: 39 yo man with scrotal abscess and cellulitis PPD1 s/p I&D with signs of sepsis.  HR is improving this AM and pt has been afebrile since last night.  Plan: -daily packing changes -NPO at midnight -AM CBC -continue broad spectrum abx -pain meds PRN -CXR due to pt symptoms of congestion   Jacalyn Lefevre, MD Urology 06/10/2019, 9:01 AM

## 2019-06-11 DIAGNOSIS — E669 Obesity, unspecified: Secondary | ICD-10-CM

## 2019-06-11 DIAGNOSIS — R200 Anesthesia of skin: Secondary | ICD-10-CM

## 2019-06-11 DIAGNOSIS — Z720 Tobacco use: Secondary | ICD-10-CM | POA: Diagnosis present

## 2019-06-11 DIAGNOSIS — A4102 Sepsis due to Methicillin resistant Staphylococcus aureus: Secondary | ICD-10-CM | POA: Diagnosis present

## 2019-06-11 DIAGNOSIS — E1165 Type 2 diabetes mellitus with hyperglycemia: Secondary | ICD-10-CM

## 2019-06-11 LAB — CBC
HCT: 40.3 % (ref 39.0–52.0)
Hemoglobin: 13.5 g/dL (ref 13.0–17.0)
MCH: 30.6 pg (ref 26.0–34.0)
MCHC: 33.5 g/dL (ref 30.0–36.0)
MCV: 91.4 fL (ref 80.0–100.0)
Platelets: 229 10*3/uL (ref 150–400)
RBC: 4.41 MIL/uL (ref 4.22–5.81)
RDW: 11.9 % (ref 11.5–15.5)
WBC: 21.4 10*3/uL — ABNORMAL HIGH (ref 4.0–10.5)
nRBC: 0 % (ref 0.0–0.2)

## 2019-06-11 LAB — GLUCOSE, CAPILLARY
Glucose-Capillary: 274 mg/dL — ABNORMAL HIGH (ref 70–99)
Glucose-Capillary: 234 mg/dL — ABNORMAL HIGH (ref 70–99)
Glucose-Capillary: 261 mg/dL — ABNORMAL HIGH (ref 70–99)
Glucose-Capillary: 217 mg/dL — ABNORMAL HIGH (ref 70–99)

## 2019-06-11 LAB — BASIC METABOLIC PANEL
Anion gap: 11 (ref 5–15)
BUN: 12 mg/dL (ref 6–20)
CO2: 19 mmol/L — ABNORMAL LOW (ref 22–32)
Calcium: 8.4 mg/dL — ABNORMAL LOW (ref 8.9–10.3)
Chloride: 106 mmol/L (ref 98–111)
Creatinine, Ser: 0.83 mg/dL (ref 0.61–1.24)
GFR calc Af Amer: >60 mL/min (ref >60)
GFR calc non Af Amer: >60 mL/min (ref >60)
Glucose, Bld: 262 mg/dL — ABNORMAL HIGH (ref 70–99)
Potassium: 4.1 mmol/L (ref 3.5–5.1)
Sodium: 136 mmol/L (ref 135–145)

## 2019-06-11 LAB — AEROBIC CULTURE W GRAM STAIN (SUPERFICIAL SPECIMEN)

## 2019-06-11 LAB — HEMOGLOBIN A1C
Hgb A1c MFr Bld: 11.9 % — ABNORMAL HIGH (ref 4.8–5.6)
Mean Plasma Glucose: 294.83 mg/dL

## 2019-06-11 MED ORDER — INSULIN STARTER KIT- PEN NEEDLES (ENGLISH)
1.0000 | Freq: Once | Status: AC
  Administered 2019-06-11: 1
  Filled 2019-06-11: qty 1

## 2019-06-11 MED ORDER — LIVING WELL WITH DIABETES BOOK
Freq: Once | Status: AC
  Filled 2019-06-11: qty 1

## 2019-06-11 MED ORDER — INSULIN ASPART 100 UNIT/ML ~~LOC~~ SOLN
0.0000 [IU] | Freq: Three times a day (TID) | SUBCUTANEOUS | Status: DC
  Administered 2019-06-11: 7 [IU] via SUBCUTANEOUS
  Administered 2019-06-11: 18:00:00 11 [IU] via SUBCUTANEOUS
  Administered 2019-06-12 (×2): 7 [IU] via SUBCUTANEOUS

## 2019-06-11 MED ORDER — INSULIN GLARGINE 100 UNIT/ML ~~LOC~~ SOLN
5.0000 [IU] | Freq: Every day | SUBCUTANEOUS | Status: DC
  Administered 2019-06-11 – 2019-06-12 (×2): 5 [IU] via SUBCUTANEOUS
  Filled 2019-06-11 (×2): qty 0.05

## 2019-06-11 MED ORDER — INSULIN ASPART 100 UNIT/ML ~~LOC~~ SOLN
0.0000 [IU] | Freq: Every day | SUBCUTANEOUS | Status: DC
  Administered 2019-06-11: 2 [IU] via SUBCUTANEOUS

## 2019-06-11 NOTE — Progress Notes (Signed)
Inpatient Diabetes Program Recommendations  AACE/ADA: New Consensus Statement on Inpatient Glycemic Control (2015)  Target Ranges:  Prepandial:   less than 140 mg/dL      Peak postprandial:   less than 180 mg/dL (1-2 hours)      Critically ill patients:  140 - 180 mg/dL   Lab Results  Component Value Date   GLUCAP 274 (H) 06/11/2019   HGBA1C 11.9 (H) 06/11/2019    Review of Glycemic Control  Diabetes history: New DM Diagnosis  Current orders for Inpatient glycemic control:  Lantus 5 units Daily Novolog 0-20 units tid + hs  Spoke with patient about new diabetes diagnosis.  Discussed A1C results (11.9% this admission) and explained what an A1C is. Discussed basic pathophysiology of DM Type 2, basic home care, importance of checking CBGs and maintaining good CBG control to prevent long-term and short-term complications. Reviewed glucose and A1C goals.  Reviewed signs and symptoms of hyperglycemia and hypoglycemia along with treatment for both. Discussed impact of nutrition, exercise, stress, sickness, and medications on diabetes control. Reviewed Living Well with diabetes booklet and encouraged patient to read through entire book. Informed patient that he will be prescribed insulin at time of d/c.  Discussed basal insulin in detail (how to take it, when to take it). Asked patient to check his glucose 2-3 times per day (fasting and alternating between the other meals and bedtime). Told pt to keep a log book of the glucose readings. Explained how the doctor he follows up with can use the log book to continue to make insulin adjustments if needed. Reviewed and demonstrated how to draw up and administer insulin with via insulin pen.  Informed patient that RN will be asking him to self-administer insulin to ensure proper technique and ability to administer self insulin shots.   Patient verbalized understanding of information discussed and he states that he has no further questions at this time related  to diabetes.   RNs to provide ongoing basic DM education at bedside with this patient and engage patient to actively check blood glucose and administer insulin injections.   Pt has [employer]. Will discuss diet in more detail tomorrow.  Lantus solostar insulin pen order [id number] Insulin pen needles order #[id number] Blood Glucose meter kit order # [id number]   Thanks, Tama Headings RN, MSN, BC-ADM Inpatient Diabetes Coordinator Team Pager [phone] (8a-5p)

## 2019-06-11 NOTE — Progress Notes (Signed)
Urology Inpatient Progress Report  Scrotal abscess [N49.2]        Intv/Subj: No acute events overnight.  Patient is without complaint.  Pt has been afebrile x 24 hours.  Pain significantly improved.    Active Problems:   Scrotal abscess  Current Facility-Administered Medications  Medication Dose Route Frequency Provider Last Rate Last Admin  . aztreonam (AZACTAM) 2 g in sodium chloride 0.9 % 100 mL IVPB  2 g Intravenous Q8H [name], RPH 200 mL/hr at 06/11/19 0535 2 g at 06/11/19 0535  . clindamycin (CLEOCIN) IVPB 900 mg  900 mg Intravenous Q8H [name], MD 100 mL/hr at 06/11/19 0429 900 mg at 06/11/19 0429  . dextrose 5 %-0.45 % sodium chloride infusion   Intravenous Continuous [name], MD 75 mL/hr at 06/09/19 1716 New Bag at 06/09/19 1716  . diphenhydrAMINE (BENADRYL) injection 12.5-25 mg  12.5-25 mg Intravenous Q6H PRN [name], MD       Or  . diphenhydrAMINE (BENADRYL) 12.5 MG/5ML elixir 12.5-25 mg  12.5-25 mg Oral Q6H PRN [name], MD   25 mg at 06/11/19 0023  . docusate sodium (COLACE) capsule 100 mg  100 mg Oral BID [name], MD   100 mg at 06/10/19 2135  . insulin aspart (novoLOG) injection 0-15 Units  0-15 Units Subcutaneous TID WC Shafin Pollio D, MD      . insulin aspart (novoLOG) injection 0-5 Units  0-5 Units Subcutaneous QHS [name], MD   4 Units at 06/10/19 2131  . morphine 2 MG/ML injection 2-4 mg  2-4 mg Intravenous Q2H PRN [name], MD      . ondansetron (ZOFRAN) injection 4 mg  4 mg Intravenous Q4H PRN [name], MD      . oxyCODONE (Oxy IR/ROXICODONE) immediate release tablet 5 mg  5 mg Oral Q4H PRN [name], MD      . vancomycin (VANCOREADY) IVPB 1250 mg/250 mL  1,250 mg Intravenous Q12H [name], RPH 166.7 mL/hr at 06/11/19 0608 1,250 mg at 06/11/19 5093     Objective: Vital: Vitals:   06/10/19 2025 06/10/19 2124 06/11/19 0531 06/11/19 0645  BP:  131/89 126/85    Pulse:  (!) 111 (!) 102   Resp: 16 18 18  (!) 25  Temp:  98.5 F (36.9 C) 98.3 F (36.8 C)   TempSrc:  Oral Oral   SpO2:  97% 95%   Weight:      Height:       I/Os: I/O last 3 completed shifts: In: 1673.9 [P.O.:240; I.V.:425.1; IV Piggyback:1008.9] Out: 4500 [Urine:4500]  Physical Exam:  General: Patient is in no apparent distress Lungs: Normal respiratory effort, chest expands symmetrically. GI: The abdomen is soft and nontender without mass. GU: outlined cellulitis area is improving as in induration in right groin and right hemiscrotum, scrotal abscess cavity packing changed thi AM; no gross puss seen Ext: lower extremities symmetric  Lab Results: Recent Labs    06/09/19 0942 06/10/19 0427 06/11/19 0343  WBC 22.7* 25.2* 21.4*  HGB 15.6 13.8 13.5  HCT 46.4 41.5 40.3   Recent Labs    06/09/19 0942 06/10/19 0427 06/11/19 0343  NA 134* 134* 136  K 3.7 3.7 4.1  CL 98 104 106  CO2 20* 16* 19*  GLUCOSE 433* 295* 262*  BUN 11 7 12   CREATININE 0.89 0.77 0.83  CALCIUM 8.7* 8.3* 8.4*   No results for input(s): LABPT, INR in  the last 72 hours. No results for input(s): LABURIN in the last 72 hours.  Blood culture: no growth x 1 day Wound culture: pending  Studies/Results: CT PELVIS W CONTRAST  Result Date: 06/09/2019 CLINICAL DATA:  Abscess near genitals, skin infection, abscess of anal and rectal regions EXAM: CT PELVIS WITH CONTRAST TECHNIQUE: Multidetector CT imaging of the pelvis was performed using the standard protocol following the bolus administration of intravenous contrast. CONTRAST:  156mL OMNIPAQUE IOHEXOL 300 MG/ML  SOLN COMPARISON:  None. FINDINGS: Urinary Tract:  No abnormality visualized. Bowel:  Unremarkable visualized pelvic bowel loops. Vascular/Lymphatic: Prominent right inguinal lymph nodes. No significant vascular abnormality seen. Reproductive:  No mass or other significant abnormality Other: Extensive soft tissue edema about the right inguinal  canal, scrotum, and base of the penis. There is edematous fluid within the right aspect of the scrotal wall without discretely circumscribed fluid collection appreciated. Musculoskeletal: No suspicious bone lesions identified. IMPRESSION: 1. Extensive soft tissue edema about the right inguinal canal, scrotum, and base of the penis. There is edematous fluid within the right aspect of the scrotal wall without discretely circumscribed fluid collection appreciated or evidence of gas-forming infection at this time, however this general constellation of findings is concerning for Fournier's gangrene. 2. No evidence of anorectal for intrapelvic perirectal infection or abscess. Electronically Signed   By: Eddie Candle M.D.   On: 06/09/2019 14:58    Assessment: 39 yo man with scrotal abscess and cellulitis and sepsis PPD2 s./p I&D scrotal absccess.  Leukocytosis declining but still elevated.  Pt HR also improving.  Blood sugars have been as high as 433 and HbA1c 11.9.    Plan: -daily packing changes -will consult internal medicine regarding undiagnosed diabetes and recommendations -continue broad spectrum antibiotics until wound culture returns -diabetic diet -SCDs at all times while in bed -will add lovenox for DVT ppx   Jacalyn Lefevre, MD Urology 06/11/2019, 7:53 AM

## 2019-06-11 NOTE — Consult Note (Addendum)
Medical Consultation   Jordan Black  CVE:[id number]  DOB: 07-20-1980  DOA: 06/09/2019  PCP: Patient, No Pcp Per  Outpatient Specialists: None   Requesting physician: Jacalyn Lefevre, urology  Reason for consultation: Diabetes management in patient with new diagnosis   History of Present Illness: Jordan Black is an 39 y.o. male with past medical history of obesity, but has not really seen a primary care physician who presented to the emergency room on the morning of 3/29 with complaints of right groin pain x2 days as well as noticing redness.  At time of presentation, wound was actively draining.  Patient evaluated and found to have an abscess of his scrotum.  Seen by urology and patient underwent I&D and admitted to the urology service.  Cultures returned positive for MRSA.  At time of admission, patient noted to have elevated blood sugars as high as the 400s, although no evidence of DKA.  Started on sliding scale, sugar staying elevated, hospitalist were called for medical management and consultation.    Review of Systems:  Patient evaluated on the floor.  Feeling better, some decreased but still present pain and soreness on the right side of his groin.  He also complains of intermittent lower extremity numbness which has been going on for almost a year although he feels like it has increased in some severity.  This occurred after he injured his back trying to lift/catch his mother about a year ago.  He also has lower back pain from this, close to tailbone area.  Patient also complains of some polyuria/polydipsia that he has noted in the past few weeks.  Denies any headaches, vision changes, dysphagia, chest pain, palpitations, shortness of breath, wheeze, cough, hematuria, dysuria, constipation, diarrhea, lower extremity weakness or pain.   Past Medical History: Obesity Suspected hyperlipidemia: Patient was told that he had high cholesterol several years ago when he had a  cholesterol test.  Did not follow-up from this.  Past Surgical History: History reviewed. No pertinent surgical history.   Allergies:   Allergies  Allergen Reactions  . Sulfa Antibiotics Hives  . Penicillins Hives and Rash    "Given while in the hospital and turned red all over, was told as if he was burned" Did it involve swelling of the face/tongue/throat, SOB, or low BP? Unknown Did it involve sudden or severe rash/hives, skin peeling, or any reaction on the inside of your mouth or nose? No Did you need to seek medical attention at a hospital or doctor's office? No When did it last happen?As a child If all above answers are "NO", may proceed with cephalosporin use.      Social History:  reports that he has been smoking. He has never used smokeless tobacco. He reports that he does not drink alcohol or use drugs.  Tells me that he is going to quit now.  Used to drink alcohol heavily, but no longer.  Denies any drug use.  Lives at home with wife.  Ambulates without assistance  Family History: Mom with diabetes with multiple complications including neuropathy, right lower leg amputation, stage IV kidney disease, strokes and MI Rest the family with CAD history   Physical Exam: Vitals:   06/10/19 2124 06/11/19 0531 06/11/19 0645 06/11/19 1251  BP: 131/89 126/85  128/82  Pulse: (!) 111 (!) 102  (!) 104  Resp: 18 18 (!) 25 18  Temp: 98.5 F (36.9 C)  98.3 F (36.8 C)  97.7 F (36.5 C)  TempSrc: Oral Oral  Oral  SpO2: 97% 95%  96%  Weight:      Height:        Constitutional: Alert and oriented x3, no acute distress Eyes: Sclera nonicteric, extraocular movements are intact ENMT: Normocephalic and atraumatic, mucous membranes are moist Neck: Supple, no JVD CVS: Regular rate and rhythm, S1-S2 Respiratory: Clear to auscultation bilaterally Abdomen: Soft, nontender, nondistended, positive bowel sounds Musculoskeletal: No clubbing or cyanosis or edema Neuro: No focal  neurological deficit, no evidence of peripheral neuropathy at this time Psych: Appropriate, no evidence of psychoses  Data reviewed:  I have personally reviewed following labs and imaging studies Labs:  CBC: Recent Labs  Lab 06/09/19 0942 06/10/19 0427 06/11/19 0343  WBC 22.7* 25.2* 21.4*  NEUTROABS 19.3*  --   --   HGB 15.6 13.8 13.5  HCT 46.4 41.5 40.3  MCV 88.0 90.0 91.4  PLT 234 234 160    Basic Metabolic Panel: Recent Labs  Lab 06/09/19 0942 06/09/19 0942 06/10/19 0427 06/11/19 0343  NA 134*  --  134* 136  K 3.7   < > 3.7 4.1  CL 98  --  104 106  CO2 20*  --  16* 19*  GLUCOSE 433*  --  295* 262*  BUN 11  --  7 12  CREATININE 0.89  --  0.77 0.83  CALCIUM 8.7*  --  8.3* 8.4*   < > = values in this interval not displayed.   GFR Estimated Creatinine Clearance: 143.2 mL/min (by C-G formula based on SCr of 0.83 mg/dL). Liver Function Tests: Recent Labs  Lab 06/09/19 0942  AST 24  ALT 33  ALKPHOS 99  BILITOT 1.7*  PROT 7.7  ALBUMIN 3.6   No results for input(s): LIPASE, AMYLASE in the last 168 hours. No results for input(s): AMMONIA in the last 168 hours. Coagulation profile No results for input(s): INR, PROTIME in the last 168 hours.  Cardiac Enzymes: No results for input(s): CKTOTAL, CKMB, CKMBINDEX, TROPONINI in the last 168 hours. BNP: Invalid input(s): POCBNP CBG: Recent Labs  Lab 06/10/19 2118 06/11/19 0759 06/11/19 1248  GLUCAP 319* 274* 234*   D-Dimer No results for input(s): DDIMER in the last 72 hours. Hgb A1c Recent Labs    06/11/19 0343  HGBA1C 11.9*   Lipid Profile No results for input(s): CHOL, HDL, LDLCALC, TRIG, CHOLHDL, LDLDIRECT in the last 72 hours. Thyroid function studies No results for input(s): TSH, T4TOTAL, T3FREE, THYROIDAB in the last 72 hours.  Invalid input(s): FREET3 Anemia work up No results for input(s): VITAMINB12, FOLATE, FERRITIN, TIBC, IRON, RETICCTPCT in the last 72 hours. Urinalysis    Component  Value Date/Time   COLORURINE YELLOW 06/09/2019 0942   APPEARANCEUR CLEAR 06/09/2019 0942   LABSPEC 1.038 (H) 06/09/2019 0942   PHURINE 6.0 06/09/2019 0942   GLUCOSEU >=500 (A) 06/09/2019 0942   HGBUR NEGATIVE 06/09/2019 0942   BILIRUBINUR NEGATIVE 06/09/2019 0942   KETONESUR 80 (A) 06/09/2019 0942   PROTEINUR 30 (A) 06/09/2019 0942   NITRITE NEGATIVE 06/09/2019 0942   LEUKOCYTESUR NEGATIVE 06/09/2019 0942     Microbiology Recent Results (from the past 240 hour(s))  Blood culture (routine x 2)     Status: None (Preliminary result)   Collection Time: 06/09/19 10:23 AM   Specimen: BLOOD  Result Value Ref Range Status   Specimen Description   Final    BLOOD RIGHT ANTECUBITAL Performed at [facility],  [place] [address] [place], [place] [address]    Special Requests   Final    BOTTLES DRAWN AEROBIC AND ANAEROBIC Blood Culture adequate volume Performed at [facility] [address], [place], [place] [address]    Culture   Final    NO GROWTH 2 DAYS Performed at [place] [address], [place], [place] [address]    Report Status PENDING  Incomplete  Blood culture (routine x 2)     Status: None (Preliminary result)   Collection Time: 06/09/19 10:23 AM   Specimen: BLOOD  Result Value Ref Range Status   Specimen Description   Final    BLOOD LEFT ANTECUBITAL Performed at [facility] [address], [place], [place] [address]    Special Requests   Final    BOTTLES DRAWN AEROBIC AND ANAEROBIC Blood Culture adequate volume Performed at [facility] [address], [place], [place] [address]    Culture   Final    NO GROWTH 2 DAYS Performed at [facility] [address], [place], [place] [address]    Report Status PENDING  Incomplete  Respiratory Panel by RT PCR (Flu A&B, Covid) - Nasopharyngeal Swab     Status: None   Collection Time: 06/09/19  4:11 PM   Specimen: Nasopharyngeal  Swab  Result Value Ref Range Status   SARS Coronavirus 2 by RT PCR NEGATIVE NEGATIVE Final    Comment: (NOTE) SARS-CoV-2 target nucleic acids are NOT DETECTED. The SARS-CoV-2 RNA is generally detectable in upper respiratoy specimens during the acute phase of infection. The lowest concentration of SARS-CoV-2 viral copies this assay can detect is 131 copies/mL. A negative result does not preclude SARS-Cov-2 infection and should not be used as the sole basis for treatment or other patient management decisions. A negative result may occur with  improper specimen collection/handling, submission of specimen other than nasopharyngeal swab, presence of viral mutation(s) within the areas targeted by this assay, and inadequate number of viral copies (<131 copies/mL). A negative result must be combined with clinical observations, patient history, and epidemiological information. The expected result is Negative. Fact Sheet for Patients:  PinkCheek.be Fact Sheet for Healthcare Providers:  GravelBags.it This test is not yet ap proved or cleared by the [place] FDA and  has been authorized for detection and/or diagnosis of SARS-CoV-2 by FDA under an Emergency Use Authorization (EUA). This EUA will remain  in effect (meaning this test can be used) for the duration of the COVID-19 declaration under Section 564(b)(1) of the Act, 21 U.S.C. section 360bbb-3(b)(1), unless the authorization is terminated or revoked sooner.    Influenza A by PCR NEGATIVE NEGATIVE Final   Influenza B by PCR NEGATIVE NEGATIVE Final    Comment: (NOTE) The Xpert Xpress SARS-CoV-2/FLU/RSV assay is intended as an aid in  the diagnosis of influenza from Nasopharyngeal swab specimens and  should not be used as a sole basis for treatment. Nasal washings and  aspirates are unacceptable for Xpert Xpress SARS-CoV-2/FLU/RSV  testing. Fact Sheet for  Patients: PinkCheek.be Fact Sheet for Healthcare Providers: GravelBags.it This test is not yet approved or cleared by the [place] FDA and  has been authorized for detection and/or diagnosis of SARS-CoV-2 by  FDA under an Emergency Use Authorization (EUA). This EUA will remain  in effect (meaning this test can be used) for the duration of the  Covid-19 declaration under Section 564(b)(1) of the Act, 21  U.S.C. section 360bbb-3(b)(1),  unless the authorization is  terminated or revoked. Performed at [facility], [place] [address], [place], [place] [address]   Aerobic Culture (superficial specimen)     Status: None   Collection Time: 06/09/19  4:11 PM   Specimen: Abscess  Result Value Ref Range Status   Specimen Description   Final    ABSCESS Performed at [facility] [address], [place], [place] [address]    Special Requests   Final    NONE Performed at [facility], [place] [address], [place], [place] [address]    Gram Stain   Final    ABUNDANT WBC PRESENT, PREDOMINANTLY PMN ABUNDANT GRAM POSITIVE COCCI Performed at [facility], [place] [address] [place], [place] [address]    Culture   Final    ABUNDANT METHICILLIN RESISTANT STAPHYLOCOCCUS AUREUS   Report Status 06/11/2019 FINAL  Final   Organism ID, Bacteria METHICILLIN RESISTANT STAPHYLOCOCCUS AUREUS  Final      Susceptibility   Methicillin resistant staphylococcus aureus - MIC*    CIPROFLOXACIN <=0.5 SENSITIVE Sensitive     ERYTHROMYCIN >=8 RESISTANT Resistant     GENTAMICIN <=0.5 SENSITIVE Sensitive     OXACILLIN >=4 RESISTANT Resistant     TETRACYCLINE <=1 SENSITIVE Sensitive     VANCOMYCIN 1 SENSITIVE Sensitive     TRIMETH/SULFA <=10 SENSITIVE Sensitive     CLINDAMYCIN <=0.25 SENSITIVE Sensitive     RIFAMPIN <=0.5 SENSITIVE Sensitive     Inducible Clindamycin NEGATIVE Sensitive     * ABUNDANT  METHICILLIN RESISTANT STAPHYLOCOCCUS AUREUS       Inpatient Medications:   Scheduled Meds: . docusate sodium  100 mg Oral BID  . insulin aspart  0-20 Units Subcutaneous TID WC  . insulin aspart  0-5 Units Subcutaneous QHS  . insulin glargine  5 Units Subcutaneous Daily  . insulin starter kit- pen needles  1 kit Other Once  . living well with diabetes book   Does not apply Once   Continuous Infusions: . aztreonam 2 g (06/11/19 0535)  . clindamycin (CLEOCIN) IV 900 mg (06/11/19 0429)  . dextrose 5 % and 0.45% NaCl 75 mL/hr at 06/09/19 1716  . vancomycin 1,250 mg (06/11/19 0608)     Radiological Exams on Admission: CT PELVIS W CONTRAST  Result Date: 06/09/2019 CLINICAL DATA:  Abscess near genitals, skin infection, abscess of anal and rectal regions EXAM: CT PELVIS WITH CONTRAST TECHNIQUE: Multidetector CT imaging of the pelvis was performed using the standard protocol following the bolus administration of intravenous contrast. CONTRAST:  165m OMNIPAQUE IOHEXOL 300 MG/ML  SOLN COMPARISON:  None. FINDINGS: Urinary Tract:  No abnormality visualized. Bowel:  Unremarkable visualized pelvic bowel loops. Vascular/Lymphatic: Prominent right inguinal lymph nodes. No significant vascular abnormality seen. Reproductive:  No mass or other significant abnormality Other: Extensive soft tissue edema about the right inguinal canal, scrotum, and base of the penis. There is edematous fluid within the right aspect of the scrotal wall without discretely circumscribed fluid collection appreciated. Musculoskeletal: No suspicious bone lesions identified. IMPRESSION: 1. Extensive soft tissue edema about the right inguinal canal, scrotum, and base of the penis. There is edematous fluid within the right aspect of the scrotal wall without discretely circumscribed fluid collection appreciated or evidence of gas-forming infection at this time, however this general constellation of findings is concerning for Fournier's  gangrene. 2. No evidence of anorectal for intrapelvic perirectal infection or abscess. Electronically Signed   By: ADorna BloomD.  On: 06/09/2019 14:58    Impression/Recommendations Principal Problem:   Sepsis due to methicillin resistant Staphylococcus aureus (MRSA) [facility]): Patient met criteria for sepsis on admission given leukocytosis and tachycardia with scrotal abscess source.  Treated with fluids and antibiotics.  Has since stabilized.   Active Problems:   Scrotal abscess: Management of abscess as per urology.Status post I&D.  Cultures positive for MRSA.    Obesity (BMI 30-39.9): Patient meets criteria for BMI greater than 30 although he has lost approximately 25 pounds in the past year with intentional weight loss and some exercise.  I have encouraged him to continue this.  Have ordered fasting lipid panel for tomorrow morning    Uncontrolled type 2 diabetes mellitus with hyperglycemia [facility]): New diagnosis.  A1c at 11.9 consistent with a CBG average of 269 over the past 3 months.  Appreciate diabetes coordinator help.  Discussed with patient extensively.  He is quite receptive to doing whatever he needs to do given that his mom has diabetes and has extensive complications including loss of leg, stage IV chronic kidney disease, CVA and heart attack.  Have started Lantus plus sliding scale.  Fortunately, patient on Medicaid so Lantus will be quite affordable.  I told the patient that his immediate priorities upon discharge in regards to diabetic care would be getting used to checking his blood sugars, taking his insulin and monitoring for episodes of hyper and hypoglycemia.  He will also need outpatient class referral.  He will need an appointment scheduled for a primary care physician before discharge  Tobacco abuse: Declined to continue patch.  Has strong motivation and wants to quit immediately.    Lower extremity numbness: Has been going on for almost a year since patient attempted to  lift/carry his mom when she fell.  Reports intermittent episodes, although he feels like this might be getting worse.  Does not appear to be related to diabetes.  At times can be severe, so we will check lumbar MRI since he is here.  Thank you for this consultation.  Our [facility] hospitalist team will follow the patient with you.   Time Spent: 50 minutes  Annita Brod M.D. [facility] 06/11/2019, 1:02 PM

## 2019-06-12 ENCOUNTER — Inpatient Hospital Stay (HOSPITAL_COMMUNITY): Payer: Medicaid Other

## 2019-06-12 DIAGNOSIS — A4102 Sepsis due to Methicillin resistant Staphylococcus aureus: Principal | ICD-10-CM

## 2019-06-12 LAB — BASIC METABOLIC PANEL
Anion gap: 10 (ref 5–15)
BUN: 15 mg/dL (ref 6–20)
CO2: 19 mmol/L — ABNORMAL LOW (ref 22–32)
Calcium: 8.4 mg/dL — ABNORMAL LOW (ref 8.9–10.3)
Chloride: 108 mmol/L (ref 98–111)
Creatinine, Ser: 0.69 mg/dL (ref 0.61–1.24)
GFR calc Af Amer: >60 mL/min (ref >60)
GFR calc non Af Amer: >60 mL/min (ref >60)
Glucose, Bld: 215 mg/dL — ABNORMAL HIGH (ref 70–99)
Potassium: 3.3 mmol/L — ABNORMAL LOW (ref 3.5–5.1)
Sodium: 137 mmol/L (ref 135–145)

## 2019-06-12 LAB — CBC
HCT: 39.9 % (ref 39.0–52.0)
Hemoglobin: 13.2 g/dL (ref 13.0–17.0)
MCH: 30 pg (ref 26.0–34.0)
MCHC: 33.1 g/dL (ref 30.0–36.0)
MCV: 90.7 fL (ref 80.0–100.0)
Platelets: 253 10*3/uL (ref 150–400)
RBC: 4.4 MIL/uL (ref 4.22–5.81)
RDW: 11.9 % (ref 11.5–15.5)
WBC: 15.9 10*3/uL — ABNORMAL HIGH (ref 4.0–10.5)
nRBC: 0 % (ref 0.0–0.2)

## 2019-06-12 LAB — LIPID PANEL
Cholesterol: 159 mg/dL (ref 0–200)
HDL: 15 mg/dL — ABNORMAL LOW (ref >40)
LDL Cholesterol: 123 mg/dL — ABNORMAL HIGH (ref 0–99)
Total CHOL/HDL Ratio: 10.6 RATIO
Triglycerides: 103 mg/dL (ref <150)
VLDL: 21 mg/dL (ref 0–40)

## 2019-06-12 LAB — GLUCOSE, CAPILLARY
Glucose-Capillary: 236 mg/dL — ABNORMAL HIGH (ref 70–99)
Glucose-Capillary: 234 mg/dL — ABNORMAL HIGH (ref 70–99)
Glucose-Capillary: 241 mg/dL — ABNORMAL HIGH (ref 70–99)
Glucose-Capillary: 223 mg/dL — ABNORMAL HIGH (ref 70–99)
Glucose-Capillary: 200 mg/dL — ABNORMAL HIGH (ref 70–99)

## 2019-06-12 LAB — MAGNESIUM: Magnesium: 2 mg/dL (ref 1.7–2.4)

## 2019-06-12 MED ORDER — METFORMIN HCL 500 MG PO TABS
500.0000 mg | ORAL_TABLET | Freq: Two times a day (BID) | ORAL | Status: DC
  Administered 2019-06-12 – 2019-06-14 (×4): 500 mg via ORAL
  Filled 2019-06-12 (×4): qty 1

## 2019-06-12 MED ORDER — DOXYCYCLINE HYCLATE 100 MG PO TABS
100.0000 mg | ORAL_TABLET | Freq: Two times a day (BID) | ORAL | Status: DC
  Administered 2019-06-12 – 2019-06-14 (×5): 100 mg via ORAL
  Filled 2019-06-12 (×5): qty 1

## 2019-06-12 MED ORDER — INSULIN GLARGINE 100 UNIT/ML ~~LOC~~ SOLN
5.0000 [IU] | Freq: Once | SUBCUTANEOUS | Status: AC
  Administered 2019-06-12: 5 [IU] via SUBCUTANEOUS
  Filled 2019-06-12: qty 0.05

## 2019-06-12 MED ORDER — LANTUS SOLOSTAR 100 UNIT/ML ~~LOC~~ SOPN: 10.0000 [IU] | PEN_INJECTOR | Freq: Every day | SUBCUTANEOUS | 0 refills | Status: DC

## 2019-06-12 MED ORDER — POTASSIUM CHLORIDE CRYS ER 20 MEQ PO TBCR
40.0000 meq | EXTENDED_RELEASE_TABLET | Freq: Every day | ORAL | Status: AC
  Administered 2019-06-12 – 2019-06-13 (×2): 40 meq via ORAL
  Filled 2019-06-12 (×2): qty 2

## 2019-06-12 MED ORDER — INSULIN GLARGINE 100 UNIT/ML ~~LOC~~ SOLN
10.0000 [IU] | Freq: Every day | SUBCUTANEOUS | Status: DC
  Administered 2019-06-13 – 2019-06-14 (×2): 10 [IU] via SUBCUTANEOUS
  Filled 2019-06-12 (×2): qty 0.1

## 2019-06-12 MED ORDER — INSULIN ASPART 100 UNIT/ML ~~LOC~~ SOLN
0.0000 [IU] | Freq: Three times a day (TID) | SUBCUTANEOUS | Status: DC
  Administered 2019-06-12 – 2019-06-13 (×2): 3 [IU] via SUBCUTANEOUS
  Administered 2019-06-13 – 2019-06-14 (×3): 2 [IU] via SUBCUTANEOUS
  Administered 2019-06-14: 3 [IU] via SUBCUTANEOUS

## 2019-06-12 MED ORDER — INSULIN PEN NEEDLE 32G X 4 MM MISC: 1.0000 "application " | Freq: Three times a day (TID) | 1 refills | Status: DC

## 2019-06-12 MED ORDER — GADOBUTROL 1 MMOL/ML IV SOLN
10.0000 mL | Freq: Once | INTRAVENOUS | Status: AC | PRN
  Administered 2019-06-12: 10 mL via INTRAVENOUS

## 2019-06-12 MED ORDER — INSULIN ASPART 100 UNIT/ML ~~LOC~~ SOLN
4.0000 [IU] | Freq: Three times a day (TID) | SUBCUTANEOUS | Status: DC
  Administered 2019-06-12 – 2019-06-14 (×6): 4 [IU] via SUBCUTANEOUS

## 2019-06-12 MED ORDER — ENOXAPARIN SODIUM 60 MG/0.6ML ~~LOC~~ SOLN
0.5000 mg/kg | SUBCUTANEOUS | Status: DC
  Administered 2019-06-12 – 2019-06-14 (×3): 50 mg via SUBCUTANEOUS
  Filled 2019-06-12 (×3): qty 0.6

## 2019-06-12 MED ORDER — INSULIN ASPART 100 UNIT/ML ~~LOC~~ SOLN: 0.0000 [IU] | Freq: Every day | SUBCUTANEOUS | Status: DC

## 2019-06-12 MED ORDER — METFORMIN HCL 500 MG PO TABS: 500.0000 mg | ORAL_TABLET | Freq: Two times a day (BID) | ORAL | 0 refills | Status: DC

## 2019-06-12 MED ORDER — BLOOD GLUCOSE METER KIT: PACK | 0 refills | Status: DC

## 2019-06-12 MED ORDER — NOVOLOG FLEXPEN 100 UNIT/ML ~~LOC~~ SOPN: 4.0000 [IU] | PEN_INJECTOR | Freq: Three times a day (TID) | SUBCUTANEOUS | 0 refills | Status: DC

## 2019-06-12 NOTE — Progress Notes (Signed)
Consult PROGRESS NOTE  Jordan Black OFB:[id number] DOB: 1981/02/22 DOA: 06/09/2019 PCP: Patient, No Pcp Per  Requesting MD: Urology, Dr. Arita Miss  Reason for consult: Diabetes management with new diagnosis   HPI/Recap of past 24 hours: Jordan Black is an 39 y.o. male with past medical history of obesity who presented to the emergency room with complaints of right groin pain x2 days as well as redness.  At time of presentation, wound was actively draining.  Patient evaluated and found to have an abscess of his R scrotum.  Seen by urology and patient underwent I&D and admitted to the urology service.  Cultures returned positive for MRSA.  At time of admission, patient noted to have elevated blood sugars as high as the 400s, although no evidence of DKA.  Started on sliding scale, sugar staying elevated, hospitalist were called for medical management and consultation.    06/12/19:  Reports his pain is well controlled.  No specific complaints this morning. CBGs improved but still in the 200's.  DC D51/2NS.  Increasing Lantus to 10 U daily adding novolog 4U TID and metformin 500 mg BID.     Assessment/Plan: Principal Problem:   Sepsis due to methicillin resistant Staphylococcus aureus (MRSA) (HCC) Active Problems:   Scrotal abscess   Obesity (BMI 30-39.9)   Uncontrolled type 2 diabetes mellitus with hyperglycemia (HCC)   Lower extremity numbness   Tobacco abuse   Newly diagnosed type 2 diabetes with hyperglycemia A1C 11.9 Increasing Lantus to 10 U daily adding novolog 4U TID and metformin 500 mg BID. LDL 123 and HDL, recommend low cholesterol diet, and weight loss   Hypokalemia K+ 3.3 Repleted with po kcl 40 meq x 2 doses Repeat serum potassium in the AM    Thank you for this consultation.  Our [facility] hospitalist team will follow the patient with you.   Objective: Vitals:   06/11/19 1251 06/11/19 2101 06/12/19 0506 06/12/19 1134  BP: 128/82 130/86 132/86 118/80  Pulse: (!) 104  99 92 94  Resp: 18 18 20 18   Temp: 97.7 F (36.5 C) 98.6 F (37 C) 98.3 F (36.8 C) 98.5 F (36.9 C)  TempSrc: Oral Oral Oral Oral  SpO2: 96% 97% 95% 98%  Weight:      Height:        Intake/Output Summary (Last 24 hours) at 06/12/2019 1253 Last data filed at 06/12/2019 1041 Gross per 24 hour  Intake 600 ml  Output 900 ml  Net -300 ml   Filed Weights   06/09/19 0931  Weight: 99.8 kg    Exam:  . General: 39 y.o. year-old male well developed well nourished in no acute distress.  Alert and oriented x3. . Cardiovascular: Regular rate and rhythm with no rubs or gallops.  No thyromegaly or JVD noted.   31 Respiratory: Clear to auscultation with no wheezes or rales. Good inspiratory effort. . Abdomen: Soft nontender nondistended with normal bowel sounds x4 quadrants. Erythema and tenderness R scrotum. . Musculoskeletal: No lower extremity edema. 2/4 pulses in all 4 extremities. Marland Kitchen Psychiatry: Mood is appropriate for condition and setting   Data Reviewed: CBC: Recent Labs  Lab 06/09/19 0942 06/10/19 0427 06/11/19 0343 06/12/19 0321  WBC 22.7* 25.2* 21.4* 15.9*  NEUTROABS 19.3*  --   --   --   HGB 15.6 13.8 13.5 13.2  HCT 46.4 41.5 40.3 39.9  MCV 88.0 90.0 91.4 90.7  PLT 234 234 229 253   Basic Metabolic Panel: Recent Labs  Lab 06/09/19 0942 06/10/19 0427 06/11/19 0343 06/12/19 0321  NA 134* 134* 136 137  K 3.7 3.7 4.1 3.3*  CL 98 104 106 108  CO2 20* 16* 19* 19*  GLUCOSE 433* 295* 262* 215*  BUN 11 7 12 15   CREATININE 0.89 0.77 0.83 0.69  CALCIUM 8.7* 8.3* 8.4* 8.4*   GFR: Estimated Creatinine Clearance: 148.6 mL/min (by C-G formula based on SCr of 0.69 mg/dL). Liver Function Tests: Recent Labs  Lab 06/09/19 0942  AST 24  ALT 33  ALKPHOS 99  BILITOT 1.7*  PROT 7.7  ALBUMIN 3.6   No results for input(s): LIPASE, AMYLASE in the last 168 hours. No results for input(s): AMMONIA in the last 168 hours. Coagulation Profile: No results for input(s): INR,  PROTIME in the last 168 hours. Cardiac Enzymes: No results for input(s): CKTOTAL, CKMB, CKMBINDEX, TROPONINI in the last 168 hours. BNP (last 3 results) No results for input(s): PROBNP in the last 8760 hours. HbA1C: Recent Labs    06/11/19 0343  HGBA1C 11.9*   CBG: Recent Labs  Lab 06/11/19 1248 06/11/19 1622 06/11/19 2052 06/12/19 0750 06/12/19 1131  GLUCAP 234* 261* 217* 236* 234*   Lipid Profile: Recent Labs    06/12/19 0321  CHOL 159  HDL 15*  LDLCALC 123*  TRIG 103  CHOLHDL 10.6   Thyroid Function Tests: No results for input(s): TSH, T4TOTAL, FREET4, T3FREE, THYROIDAB in the last 72 hours. Anemia Panel: No results for input(s): VITAMINB12, FOLATE, FERRITIN, TIBC, IRON, RETICCTPCT in the last 72 hours. Urine analysis:    Component Value Date/Time   COLORURINE YELLOW 06/09/2019 0942   APPEARANCEUR CLEAR 06/09/2019 0942   LABSPEC 1.038 (H) 06/09/2019 0942   PHURINE 6.0 06/09/2019 0942   GLUCOSEU >=500 (A) 06/09/2019 0942   HGBUR NEGATIVE 06/09/2019 0942   BILIRUBINUR NEGATIVE 06/09/2019 0942   KETONESUR 80 (A) 06/09/2019 0942   PROTEINUR 30 (A) 06/09/2019 0942   NITRITE NEGATIVE 06/09/2019 0942   LEUKOCYTESUR NEGATIVE 06/09/2019 0942   Sepsis Labs: @LABRCNTIP (procalcitonin:4,lacticidven:4)  ) Recent Results (from the past 240 hour(s))  Blood culture (routine x 2)     Status: None (Preliminary result)   Collection Time: 06/09/19 10:23 AM   Specimen: BLOOD  Result Value Ref Range Status   Specimen Description   Final    BLOOD RIGHT ANTECUBITAL Performed at [facility], [address], [place], [place]    Special Requests   Final    BOTTLES DRAWN AEROBIC AND ANAEROBIC Blood Culture adequate volume Performed at [facility], [address] [place], [place]    Culture   Final    NO GROWTH 3 DAYS Performed at [facility], [address] [place], [address] [place]    Report Status  PENDING  Incomplete  Blood culture (routine x 2)     Status: None (Preliminary result)   Collection Time: 06/09/19 10:23 AM   Specimen: BLOOD  Result Value Ref Range Status   Specimen Description   Final    BLOOD LEFT ANTECUBITAL Performed at [facility], [address], [place], [place]    Special Requests   Final    BOTTLES DRAWN AEROBIC AND ANAEROBIC Blood Culture adequate volume Performed at [facility], [address], [place], [place]    Culture   Final    NO GROWTH 3 DAYS Performed at [facility], [address] [place], [address] [place]  Report Status PENDING  Incomplete  Respiratory Panel by RT PCR (Flu A&B, Covid) - Nasopharyngeal Swab     Status: None   Collection Time: 06/09/19  4:11 PM   Specimen: Nasopharyngeal Swab  Result Value Ref Range Status   SARS Coronavirus 2 by RT PCR NEGATIVE NEGATIVE Final    Comment: (NOTE) SARS-CoV-2 target nucleic acids are NOT DETECTED. The SARS-CoV-2 RNA is generally detectable in upper respiratoy specimens during the acute phase of infection. The lowest concentration of SARS-CoV-2 viral copies this assay can detect is 131 copies/mL. A negative result does not preclude SARS-Cov-2 infection and should not be used as the sole basis for treatment or other patient management decisions. A negative result may occur with  improper specimen collection/handling, submission of specimen other than nasopharyngeal swab, presence of viral mutation(s) within the areas targeted by this assay, and inadequate number of viral copies (<131 copies/mL). A negative result must be combined with clinical observations, patient history, and epidemiological information. The expected result is Negative. Fact Sheet for Patients:  PinkCheek.be Fact Sheet for Healthcare Providers:  GravelBags.it This test is not yet ap proved or  cleared by the [place] FDA and  has been authorized for detection and/or diagnosis of SARS-CoV-2 by FDA under an Emergency Use Authorization (EUA). This EUA will remain  in effect (meaning this test can be used) for the duration of the COVID-19 declaration under Section 564(b)(1) of the Act, 21 U.S.C. section 360bbb-3(b)(1), unless the authorization is terminated or revoked sooner.    Influenza A by PCR NEGATIVE NEGATIVE Final   Influenza B by PCR NEGATIVE NEGATIVE Final    Comment: (NOTE) The Xpert Xpress SARS-CoV-2/FLU/RSV assay is intended as an aid in  the diagnosis of influenza from Nasopharyngeal swab specimens and  should not be used as a sole basis for treatment. Nasal washings and  aspirates are unacceptable for Xpert Xpress SARS-CoV-2/FLU/RSV  testing. Fact Sheet for Patients: PinkCheek.be Fact Sheet for Healthcare Providers: GravelBags.it This test is not yet approved or cleared by the [place] FDA and  has been authorized for detection and/or diagnosis of SARS-CoV-2 by  FDA under an Emergency Use Authorization (EUA). This EUA will remain  in effect (meaning this test can be used) for the duration of the  Covid-19 declaration under Section 564(b)(1) of the Act, 21  U.S.C. section 360bbb-3(b)(1), unless the authorization is  terminated or revoked. Performed at [facility], [place] [address], [place], [place] [address]   Aerobic Culture (superficial specimen)     Status: None   Collection Time: 06/09/19  4:11 PM   Specimen: Abscess  Result Value Ref Range Status   Specimen Description   Final    ABSCESS Performed at [place] [address], [place], [place] [address]    Special Requests   Final    NONE Performed at [facility], [place] [address] [place], [place] [address]    Gram Stain   Final    ABUNDANT WBC PRESENT, PREDOMINANTLY  PMN ABUNDANT GRAM POSITIVE COCCI Performed at [facility], [place] [address], [place], [place] [address]    Culture   Final    ABUNDANT METHICILLIN RESISTANT STAPHYLOCOCCUS AUREUS   Report Status 06/11/2019 FINAL  Final   Organism ID, Bacteria METHICILLIN RESISTANT STAPHYLOCOCCUS AUREUS  Final      Susceptibility   Methicillin resistant staphylococcus aureus - MIC*    CIPROFLOXACIN <=0.5 SENSITIVE Sensitive     ERYTHROMYCIN >=8 RESISTANT  Resistant     GENTAMICIN <=0.5 SENSITIVE Sensitive     OXACILLIN >=4 RESISTANT Resistant     TETRACYCLINE <=1 SENSITIVE Sensitive     VANCOMYCIN 1 SENSITIVE Sensitive     TRIMETH/SULFA <=10 SENSITIVE Sensitive     CLINDAMYCIN <=0.25 SENSITIVE Sensitive     RIFAMPIN <=0.5 SENSITIVE Sensitive     Inducible Clindamycin NEGATIVE Sensitive     * ABUNDANT METHICILLIN RESISTANT STAPHYLOCOCCUS AUREUS      Studies: No results found.  Scheduled Meds: . docusate sodium  100 mg Oral BID  . doxycycline  100 mg Oral Q12H  . enoxaparin (LOVENOX) injection  0.5 mg/kg Subcutaneous Q24H  . insulin aspart  0-20 Units Subcutaneous TID WC  . insulin aspart  0-5 Units Subcutaneous QHS  . insulin glargine  5 Units Subcutaneous Daily  . potassium chloride  40 mEq Oral Daily    Continuous Infusions: . dextrose 5 % and 0.45% NaCl 75 mL/hr at 06/09/19 1716     LOS: 2 days     Darlin Drop, MD [facility] Pager [phone]  If 7PM-7AM, please contact night-coverage [email or web address] Password [facility] 06/12/2019, 12:53 PM

## 2019-06-12 NOTE — Progress Notes (Addendum)
Inpatient Diabetes Program Recommendations  AACE/ADA: New Consensus Statement on Inpatient Glycemic Control (2015)  Target Ranges:  Prepandial:   less than 140 mg/dL      Peak postprandial:   less than 180 mg/dL (1-2 hours)      Critically ill patients:  140 - 180 mg/dL   Lab Results  Component Value Date   GLUCAP 236 (H) 06/12/2019   HGBA1C 11.9 (H) 06/11/2019    Review of Glycemic Control Results for [name], [name] (MRN [id number]) as of 06/12/2019 09:33  Ref. Range 06/11/2019 07:59 06/11/2019 12:48 06/11/2019 16:22 06/11/2019 20:52 06/12/2019 07:50  Glucose-Capillary Latest Ref Range: 70 - 99 mg/dL 274 (H) 234 (H) 261 (H) 217 (H) 236 (H)   Diabetes history: New DM Diagnosis  Current orders for Inpatient glycemic control:  Lantus 5 units Daily Novolog 0-20 units tid + hs   -  Increase Lantus to 15-20 units (0.2 units/kg) (trying to dose for home without the Novolog doses)  -  Reduce Novolog Correction to 0-9 units tid    Pt has [employer]. Will discuss diet in more detail today.  Lantus solostar insulin pen order [id number] Insulin pen needles order #[id number] Blood Glucose meter kit order # [id number]   Addendum 1145 am:  Spoke with pt and wife again today. Few questions answered in relation to diet, portion control, and carbohydrates. Pt and wife do not have any other questions at this time. Pt to have dressing change by Dr. Claudia Desanctis.  Thanks, Tama Headings RN, MSN, BC-ADM Inpatient Diabetes Coordinator Team Pager [phone] (8a-5p)

## 2019-06-12 NOTE — Progress Notes (Signed)
Urology Inpatient Progress Report  Scrotal abscess [N49.2]        Intv/Subj: No acute events overnight.  Patient is without complaint.  No fever, chills, nausea or vomiting.    Principal Problem:   Sepsis due to methicillin resistant Staphylococcus aureus (MRSA) (HCC) Active Problems:   Scrotal abscess   Obesity (BMI 30-39.9)   Uncontrolled type 2 diabetes mellitus with hyperglycemia (HCC)   Lower extremity numbness   Tobacco abuse  Current Facility-Administered Medications  Medication Dose Route Frequency Provider Last Rate Last Admin  . dextrose 5 %-0.45 % sodium chloride infusion   Intravenous Continuous [name], MD 75 mL/hr at 06/09/19 1716 New Bag at 06/09/19 1716  . diphenhydrAMINE (BENADRYL) injection 12.5-25 mg  12.5-25 mg Intravenous Q6H PRN [name], MD       Or  . diphenhydrAMINE (BENADRYL) 12.5 MG/5ML elixir 12.5-25 mg  12.5-25 mg Oral Q6H PRN [name], MD   25 mg at 06/11/19 0023  . docusate sodium (COLACE) capsule 100 mg  100 mg Oral BID [name], MD   100 mg at 06/10/19 2135  . doxycycline (VIBRA-TABS) tablet 100 mg  100 mg Oral Q12H Bob Daversa D, MD      . enoxaparin (LOVENOX) injection 50 mg  0.5 mg/kg Subcutaneous Q24H Wenzel Backlund, Roselee Nova D, MD      . insulin aspart (novoLOG) injection 0-20 Units  0-20 Units Subcutaneous TID WC Hollice Espy, MD   7 Units at 06/12/19 [phone]  . insulin aspart (novoLOG) injection 0-5 Units  0-5 Units Subcutaneous QHS Hollice Espy, MD   2 Units at 06/11/19 2157  . insulin glargine (LANTUS) injection 5 Units  5 Units Subcutaneous Daily Hollice Espy, MD   5 Units at 06/11/19 1211  . morphine 2 MG/ML injection 2-4 mg  2-4 mg Intravenous Q2H PRN [name], MD      . ondansetron (ZOFRAN) injection 4 mg  4 mg Intravenous Q4H PRN [name], MD      . oxyCODONE (Oxy IR/ROXICODONE) immediate release tablet 5 mg  5 mg Oral Q4H PRN [name], MD   5 mg at 06/12/19 0140   . potassium chloride SA (KLOR-CON) CR tablet 40 mEq  40 mEq Oral Daily Dow Adolph N, DO   40 mEq at 06/12/19 0606     Objective: Vital: Vitals:   06/11/19 0645 06/11/19 1251 06/11/19 2101 06/12/19 0506  BP:  128/82 130/86 132/86  Pulse:  (!) 104 99 92  Resp: (!) 25 18 18 20   Temp:  97.7 F (36.5 C) 98.6 F (37 C) 98.3 F (36.8 C)  TempSrc:  Oral Oral Oral  SpO2:  96% 97% 95%  Weight:      Height:       I/Os: I/O last 3 completed shifts: In: 660 [P.O.:360; IV Piggyback:300] Out: 1350 [Urine:1350]  Physical Exam:  General: Patient is in no apparent distress Lungs: Normal respiratory effort, chest expands symmetrically. GI: The abdomen is soft and nontender without mass. GU: erythema and induration continue to improve, right hemiscrotum softer, packing to be changed mid day  Ext: lower extremities symmetric  Lab Results: Recent Labs    06/10/19 0427 06/11/19 0343 06/12/19 0321  WBC 25.2* 21.4* 15.9*  HGB 13.8 13.5 13.2  HCT 41.5 40.3 39.9   Recent Labs    06/10/19 0427 06/11/19 0343 06/12/19 0321  NA 134* 136 137  K 3.7 4.1 3.3*  CL 104 106 108  CO2 16* 19* 19*  GLUCOSE 295* 262* 215*  BUN 7 12 15   CREATININE 0.77 0.83 0.69  CALCIUM 8.3* 8.4* 8.4*   No results for input(s): LABPT, INR in the last 72 hours. No results for input(s): LABURIN in the last 72 hours. Results for orders placed or performed during the hospital encounter of 06/09/19  Blood culture (routine x 2)     Status: None (Preliminary result)   Collection Time: 06/09/19 10:23 AM   Specimen: BLOOD  Result Value Ref Range Status   Specimen Description   Final    BLOOD RIGHT ANTECUBITAL Performed at [facility], [address] [place], [place]    Special Requests   Final    BOTTLES DRAWN AEROBIC AND ANAEROBIC Blood Culture adequate volume Performed at [facility], [address], [place], [place]    Culture   Final    NO  GROWTH 3 DAYS Performed at [facility], [address], [place], [place]    Report Status PENDING  Incomplete  Blood culture (routine x 2)     Status: None (Preliminary result)   Collection Time: 06/09/19 10:23 AM   Specimen: BLOOD  Result Value Ref Range Status   Specimen Description   Final    BLOOD LEFT ANTECUBITAL Performed at [facility], [address] [place], [place]    Special Requests   Final    BOTTLES DRAWN AEROBIC AND ANAEROBIC Blood Culture adequate volume Performed at [facility], [address] [place], [place]    Culture   Final    NO GROWTH 3 DAYS Performed at [facility], [address] [place], [place]    Report Status PENDING  Incomplete  Respiratory Panel by RT PCR (Flu A&B, Covid) - Nasopharyngeal Swab     Status: None   Collection Time: 06/09/19  4:11 PM   Specimen: Nasopharyngeal Swab  Result Value Ref Range Status   SARS Coronavirus 2 by RT PCR NEGATIVE NEGATIVE Final    Comment: (NOTE) SARS-CoV-2 target nucleic acids are NOT DETECTED. The SARS-CoV-2 RNA is generally detectable in upper respiratoy specimens during the acute phase of infection. The lowest concentration of SARS-CoV-2 viral copies this assay can detect is 131 copies/mL. A negative result does not preclude SARS-Cov-2 infection and should not be used as the sole basis for treatment or other patient management decisions. A negative result may occur with  improper specimen collection/handling, submission of specimen other than nasopharyngeal swab, presence of viral mutation(s) within the areas targeted by this assay, and inadequate number of viral copies (<131 copies/mL). A negative result must be combined with clinical observations, patient history, and epidemiological information. The expected result is Negative. Fact Sheet for Patients:  06/11/19 Fact Sheet  for Healthcare Providers:  https://www.moore.com/ This test is not yet ap proved or cleared by the https://www.young.biz/ FDA and  has been authorized for detection and/or diagnosis of SARS-CoV-2 by FDA under an Emergency Use Authorization (EUA). This EUA will remain  in effect (meaning this test can be used) for the duration of the COVID-19 declaration under Section 564(b)(1) of the Act, 21 U.S.C. section 360bbb-3(b)(1), unless the authorization is terminated or revoked sooner.    Influenza A by PCR NEGATIVE NEGATIVE Final   Influenza B by PCR NEGATIVE NEGATIVE Final    Comment: (NOTE) The Xpert Xpress SARS-CoV-2/FLU/RSV assay is intended as an aid in  the diagnosis of influenza from Nasopharyngeal swab specimens and  should not be used as a sole basis for treatment. Nasal washings and  aspirates are unacceptable for Xpert Xpress SARS-CoV-2/FLU/RSV  testing. Fact Sheet for Patients: PinkCheek.be Fact Sheet for Healthcare Providers: GravelBags.it This test is not yet approved or cleared by the [place] FDA and  has been authorized for detection and/or diagnosis of SARS-CoV-2 by  FDA under an Emergency Use Authorization (EUA). This EUA will remain  in effect (meaning this test can be used) for the duration of the  Covid-19 declaration under Section 564(b)(1) of the Act, 21  U.S.C. section 360bbb-3(b)(1), unless the authorization is  terminated or revoked. Performed at [facility], [place] [address], [place], [place] [address]   Aerobic Culture (superficial specimen)     Status: None   Collection Time: 06/09/19  4:11 PM   Specimen: Abscess  Result Value Ref Range Status   Specimen Description   Final    ABSCESS Performed at [facility] [address], [place], [place] [address]    Special Requests   Final    NONE Performed at [facility], [place]  [address], [place], [place] [address]    Gram Stain   Final    ABUNDANT WBC PRESENT, PREDOMINANTLY PMN ABUNDANT GRAM POSITIVE COCCI Performed at [facility], [place] [address], [place], [place] [address]    Culture   Final    ABUNDANT METHICILLIN RESISTANT STAPHYLOCOCCUS AUREUS   Report Status 06/11/2019 FINAL  Final   Organism ID, Bacteria METHICILLIN RESISTANT STAPHYLOCOCCUS AUREUS  Final      Susceptibility   Methicillin resistant staphylococcus aureus - MIC*    CIPROFLOXACIN <=0.5 SENSITIVE Sensitive     ERYTHROMYCIN >=8 RESISTANT Resistant     GENTAMICIN <=0.5 SENSITIVE Sensitive     OXACILLIN >=4 RESISTANT Resistant     TETRACYCLINE <=1 SENSITIVE Sensitive     VANCOMYCIN 1 SENSITIVE Sensitive     TRIMETH/SULFA <=10 SENSITIVE Sensitive     CLINDAMYCIN <=0.25 SENSITIVE Sensitive     RIFAMPIN <=0.5 SENSITIVE Sensitive     Inducible Clindamycin NEGATIVE Sensitive     * ABUNDANT METHICILLIN RESISTANT STAPHYLOCOCCUS AUREUS    Studies/Results: No results found.  Assessment: 39 yo man with scrotal abscess and cellulitis and sepsis PPD3 s/p I&D scrotal absccess.  Wound culture shows MRSA.  Patient also with newly diagnosed DM.  Leukocytosis continues to improve.   Plan: -daily packing changes -appreciate internal medicine regarding newly diagnosed diabetes and recommendations -d/c IV antibiotics and started doxycycline this AM based on culture sensitivites -diabetic diet -SCDs at all times while in bed -lovenox for DVT ppx -d/c planning for tomorrow    Jacalyn Lefevre, MD Urology 06/12/2019, 9:31 AM

## 2019-06-13 LAB — CBC
HCT: 45.3 % (ref 39.0–52.0)
Hemoglobin: 14.7 g/dL (ref 13.0–17.0)
MCH: 29.3 pg (ref 26.0–34.0)
MCHC: 32.5 g/dL (ref 30.0–36.0)
MCV: 90.2 fL (ref 80.0–100.0)
Platelets: 308 10*3/uL (ref 150–400)
RBC: 5.02 MIL/uL (ref 4.22–5.81)
RDW: 11.9 % (ref 11.5–15.5)
WBC: 12.8 10*3/uL — ABNORMAL HIGH (ref 4.0–10.5)
nRBC: 0 % (ref 0.0–0.2)

## 2019-06-13 LAB — BASIC METABOLIC PANEL
Anion gap: 12 (ref 5–15)
BUN: 15 mg/dL (ref 6–20)
CO2: 22 mmol/L (ref 22–32)
Calcium: 8.8 mg/dL — ABNORMAL LOW (ref 8.9–10.3)
Chloride: 107 mmol/L (ref 98–111)
Creatinine, Ser: 0.72 mg/dL (ref 0.61–1.24)
GFR calc Af Amer: >60 mL/min (ref >60)
GFR calc non Af Amer: >60 mL/min (ref >60)
Glucose, Bld: 177 mg/dL — ABNORMAL HIGH (ref 70–99)
Potassium: 3.4 mmol/L — ABNORMAL LOW (ref 3.5–5.1)
Sodium: 141 mmol/L (ref 135–145)

## 2019-06-13 LAB — GLUCOSE, CAPILLARY
Glucose-Capillary: 185 mg/dL — ABNORMAL HIGH (ref 70–99)
Glucose-Capillary: 239 mg/dL — ABNORMAL HIGH (ref 70–99)
Glucose-Capillary: 164 mg/dL — ABNORMAL HIGH (ref 70–99)
Glucose-Capillary: 179 mg/dL — ABNORMAL HIGH (ref 70–99)

## 2019-06-13 MED ORDER — BLOOD GLUCOSE METER KIT: PACK | 0 refills | Status: AC

## 2019-06-13 MED ORDER — SENNOSIDES-DOCUSATE SODIUM 8.6-50 MG PO TABS: 2.0000 | ORAL_TABLET | Freq: Every day | ORAL | 1 refills | Status: AC | PRN

## 2019-06-13 MED ORDER — NOVOLOG FLEXPEN 100 UNIT/ML ~~LOC~~ SOPN: 4.0000 [IU] | PEN_INJECTOR | Freq: Three times a day (TID) | SUBCUTANEOUS | 0 refills | Status: AC

## 2019-06-13 MED ORDER — POTASSIUM CHLORIDE CRYS ER 20 MEQ PO TBCR
20.0000 meq | EXTENDED_RELEASE_TABLET | Freq: Every day | ORAL | Status: DC
  Administered 2019-06-13 – 2019-06-14 (×2): 20 meq via ORAL
  Filled 2019-06-13 (×2): qty 1

## 2019-06-13 MED ORDER — LANTUS SOLOSTAR 100 UNIT/ML ~~LOC~~ SOPN: 10.0000 [IU] | PEN_INJECTOR | Freq: Every day | SUBCUTANEOUS | 0 refills | Status: AC

## 2019-06-13 MED ORDER — INSULIN PEN NEEDLE 32G X 4 MM MISC: 1.0000 "application " | Freq: Three times a day (TID) | 1 refills | Status: AC

## 2019-06-13 MED ORDER — POTASSIUM CHLORIDE CRYS ER 20 MEQ PO TBCR
40.0000 meq | EXTENDED_RELEASE_TABLET | Freq: Once | ORAL | Status: AC
  Administered 2019-06-13: 40 meq via ORAL
  Filled 2019-06-13: qty 2

## 2019-06-13 MED ORDER — POTASSIUM CHLORIDE CRYS ER 20 MEQ PO TBCR: 20.0000 meq | EXTENDED_RELEASE_TABLET | Freq: Every day | ORAL | Status: AC

## 2019-06-13 MED ORDER — METFORMIN HCL 500 MG PO TABS: 500.0000 mg | ORAL_TABLET | Freq: Two times a day (BID) | ORAL | 0 refills | Status: AC

## 2019-06-13 MED ORDER — OXYCODONE-ACETAMINOPHEN 5-325 MG PO TABS: 1.0000 | ORAL_TABLET | ORAL | 0 refills | Status: AC | PRN

## 2019-06-13 MED ORDER — DOXYCYCLINE MONOHYDRATE 100 MG PO TABS: 100.0000 mg | ORAL_TABLET | Freq: Two times a day (BID) | ORAL | 0 refills | Status: DC

## 2019-06-13 NOTE — Discharge Summary (Signed)
Date of admission: 06/09/2019  Date of discharge: 06/14/19  Admission diagnosis: scrotal abscess  Discharge diagnosis: scrotal abscess  Secondary diagnoses:  Patient Active Problem List   Diagnosis Date Noted  . Obesity (BMI 30-39.9) 06/11/2019  . Uncontrolled type 2 diabetes mellitus with hyperglycemia ([place]) 06/11/2019  . Sepsis due to methicillin resistant Staphylococcus aureus (MRSA) ([place]) 06/11/2019  . Lower extremity numbness 06/11/2019  . Tobacco abuse 06/11/2019  . Scrotal abscess 06/09/2019    Procedures performed:   History and Physical: For full details, please see admission history and physical. Briefly, Jordan Black is a 39 y.o. year old patient with scrotal abscess and sepsis s/p I&D at the bedside.  He was then admitted for IV antibiotics and wound culture results.  He was successfully transitioned to p.o. antibiotics based on culture sensitivities and ultimately discharged home on PPD5.  Hospital Course: Patient tolerated the procedure well.  He was then transferred to the floor after an uneventful PACU stay.  His hospital course was uncomplicated.  On PPD#5 he had met discharge criteria: was eating a regular diet, was up and ambulating independently,  pain was well controlled, was voiding without a catheter, and was ready to for discharge.   Laboratory values:  Recent Labs    06/11/19 0343 06/12/19 0321 06/13/19 0345  WBC 21.4* 15.9* 12.8*  HGB 13.5 13.2 14.7  HCT 40.3 39.9 45.3   Recent Labs    06/11/19 0343 06/12/19 0321 06/13/19 0345  NA 136 137 141  K 4.1 3.3* 3.4*  CL 106 108 107  CO2 19* 19* 22  GLUCOSE 262* 215* 177*  BUN 12 15 15   CREATININE 0.83 0.69 0.72  CALCIUM 8.4* 8.4* 8.8*   No results for input(s): LABPT, INR in the last 72 hours. No results for input(s): LABURIN in the last 72 hours. Results for orders placed or performed during the hospital encounter of 06/09/19  Blood culture (routine x 2)     Status: None (Preliminary result)    Collection Time: 06/09/19 10:23 AM   Specimen: BLOOD  Result Value Ref Range Status   Specimen Description   Final    BLOOD RIGHT ANTECUBITAL Performed at [facility] [address], [place], [place] [address]    Special Requests   Final    BOTTLES DRAWN AEROBIC AND ANAEROBIC Blood Culture adequate volume Performed at [facility] [address], [place], [place] [address]    Culture   Final    NO GROWTH 3 DAYS Performed at [facility], [place] [address] [place], [place] [address]    Report Status PENDING  Incomplete  Blood culture (routine x 2)     Status: None (Preliminary result)   Collection Time: 06/09/19 10:23 AM   Specimen: BLOOD  Result Value Ref Range Status   Specimen Description   Final    BLOOD LEFT ANTECUBITAL Performed at [facility] [address] [place], [place] [address]    Special Requests   Final    BOTTLES DRAWN AEROBIC AND ANAEROBIC Blood Culture adequate volume Performed at [facility] [address] [place], [place] [address]    Culture   Final    NO GROWTH 3 DAYS Performed at [facility], [place] [address], [place], [place] [address]    Report Status PENDING  Incomplete  Respiratory Panel by RT PCR (Flu A&B, Covid) - Nasopharyngeal Swab     Status: None   Collection Time:  06/09/19  4:11 PM   Specimen: Nasopharyngeal Swab  Result Value Ref Range Status   SARS Coronavirus 2 by RT PCR NEGATIVE NEGATIVE Final    Comment: (NOTE) SARS-CoV-2 target nucleic acids are NOT DETECTED. The SARS-CoV-2 RNA is generally detectable in upper respiratoy specimens during the acute phase of infection. The lowest concentration of SARS-CoV-2 viral copies this assay can detect is 131 copies/mL. A negative result does not preclude SARS-Cov-2 infection and should not be used as the sole basis for treatment or other patient management decisions. A negative result may occur with   improper specimen collection/handling, submission of specimen other than nasopharyngeal swab, presence of viral mutation(s) within the areas targeted by this assay, and inadequate number of viral copies (<131 copies/mL). A negative result must be combined with clinical observations, patient history, and epidemiological information. The expected result is Negative. Fact Sheet for Patients:  PinkCheek.be Fact Sheet for Healthcare Providers:  GravelBags.it This test is not yet ap proved or cleared by the [place] FDA and  has been authorized for detection and/or diagnosis of SARS-CoV-2 by FDA under an Emergency Use Authorization (EUA). This EUA will remain  in effect (meaning this test can be used) for the duration of the COVID-19 declaration under Section 564(b)(1) of the Act, 21 U.S.C. section 360bbb-3(b)(1), unless the authorization is terminated or revoked sooner.    Influenza A by PCR NEGATIVE NEGATIVE Final   Influenza B by PCR NEGATIVE NEGATIVE Final    Comment: (NOTE) The Xpert Xpress SARS-CoV-2/FLU/RSV assay is intended as an aid in  the diagnosis of influenza from Nasopharyngeal swab specimens and  should not be used as a sole basis for treatment. Nasal washings and  aspirates are unacceptable for Xpert Xpress SARS-CoV-2/FLU/RSV  testing. Fact Sheet for Patients: PinkCheek.be Fact Sheet for Healthcare Providers: GravelBags.it This test is not yet approved or cleared by the [place] FDA and  has been authorized for detection and/or diagnosis of SARS-CoV-2 by  FDA under an Emergency Use Authorization (EUA). This EUA will remain  in effect (meaning this test can be used) for the duration of the  Covid-19 declaration under Section 564(b)(1) of the Act, 21  U.S.C. section 360bbb-3(b)(1), unless the authorization is  terminated or revoked. Performed at  [facility], [place] [address] [place], [place] [address]   Aerobic Culture  (superficial specimen)     Status: None   Collection Time: 06/09/19  4:11 PM   Specimen: Abscess  Result Value Ref Range Status   Specimen Description   Final    ABSCESS Performed at [facility] [address] [place], [place] [address]    Special Requests   Final    NONE Performed at [facility], [place] [address] [place], [place] [address]    Gram Stain   Final    ABUNDANT WBC PRESENT, PREDOMINANTLY PMN ABUNDANT GRAM POSITIVE COCCI Performed at [facility], [place] [address], [place], [place] [address]    Culture   Final    ABUNDANT METHICILLIN RESISTANT STAPHYLOCOCCUS AUREUS   Report Status 06/11/2019 FINAL  Final   Organism ID, Bacteria METHICILLIN RESISTANT STAPHYLOCOCCUS AUREUS  Final      Susceptibility   Methicillin resistant staphylococcus aureus - MIC*    CIPROFLOXACIN <=0.5 SENSITIVE Sensitive     ERYTHROMYCIN >=8 RESISTANT Resistant     GENTAMICIN <=0.5 SENSITIVE Sensitive     OXACILLIN >=4 RESISTANT Resistant     TETRACYCLINE <=1 SENSITIVE Sensitive  VANCOMYCIN 1 SENSITIVE Sensitive     TRIMETH/SULFA <=10 SENSITIVE Sensitive     CLINDAMYCIN <=0.25 SENSITIVE Sensitive     RIFAMPIN <=0.5 SENSITIVE Sensitive     Inducible Clindamycin NEGATIVE Sensitive     * ABUNDANT METHICILLIN RESISTANT STAPHYLOCOCCUS AUREUS    Disposition: Home  Discharge instruction: The patient was instructed to be ambulatory but told to refrain from heavy lifting, strenuous activity, or driving.   Discharge medications:  Allergies as of 06/13/2019       Reactions   Sulfa Antibiotics Hives   Penicillins Hives, Rash   "Given while in the hospital and turned red all over, was told as if he was burned" Did it involve swelling of the face/tongue/throat, SOB, or low BP? Unknown Did it involve sudden or severe rash/hives, skin peeling, or any reaction  on the inside of your mouth or nose? No Did you need to seek medical attention at a hospital or doctor's office? No When did it last happen?  As a child     If all above answers are "NO", may proceed with cephalosporin use.        Medication List     TAKE these medications    blood glucose meter kit and supplies Dispense based on patient and insurance preference. Use up to four times daily as directed. (FOR ICD-10 E10.9, E11.9).   doxycycline 100 MG tablet Commonly known as: ADOXA Take 1 tablet (100 mg total) by mouth 2 (two) times daily.   Insulin Pen Needle 32G X 4 MM Misc 1 application by Does not apply route 4 (four) times daily -  before meals and at bedtime.   Lantus SoloStar 100 UNIT/ML Solostar Pen Generic drug: insulin glargine Inject 10 Units into the skin daily.   metFORMIN 500 MG tablet Commonly known as: Glucophage Take 1 tablet (500 mg total) by mouth 2 (two) times daily with a meal.   NovoLOG FlexPen 100 UNIT/ML FlexPen Generic drug: insulin aspart Inject 4 Units into the skin 3 (three) times daily with meals.   oxyCODONE-acetaminophen 5-325 MG tablet Commonly known as: Percocet Take 1 tablet by mouth every 4 (four) hours as needed for severe pain.   potassium chloride SA 20 MEQ tablet Commonly known as: KLOR-CON Take 1 tablet (20 mEq total) by mouth daily for 3 days.   senna-docusate 8.6-50 MG tablet Commonly known as: Senokot-S Take 2 tablets by mouth daily as needed for mild constipation.        Followup:  Follow-up Information     [facility]. Call in 1 day(s).   Why: please call for a post hospital follow up appointment. Contact information: [place] [id number] [place].   Why: Monday, April 5th at 10:00am with Dr. Oretha Ellis information: [place]

## 2019-06-13 NOTE — Plan of Care (Signed)

## 2019-06-13 NOTE — Progress Notes (Signed)
Inpatient Diabetes Program Recommendations  AACE/ADA: New Consensus Statement on Inpatient Glycemic Control (2015)  Target Ranges:  Prepandial:   less than 140 mg/dL      Peak postprandial:   less than 180 mg/dL (1-2 hours)      Critically ill patients:  140 - 180 mg/dL   Lab Results  Component Value Date   GLUCAP 239 (H) 06/13/2019   HGBA1C 11.9 (H) 06/11/2019    Review of Glycemic Control  Spoke with pt and wife again to reinforce education. Spoke with pt in detail regarding new home regimen of insulin with Lantus and Novolog, metformin.  Discussed how and when to give the insulin. Discussed diet again.   Lantus solostar insulin pen order [id number] Insulin pen needles order #[id number] Novolog flexpen order #  [id number] Blood Glucose meter kit order # [id number]  Thanks,  Tama Headings RN, MSN, BC-ADM Inpatient Diabetes Coordinator Team Pager [phone] (8a-5p)

## 2019-06-13 NOTE — Progress Notes (Signed)
Urology Inpatient Progress Report  Scrotal abscess [N49.2]        Intv/Subj: No acute events overnight. Patient is without complaint.  Principal Problem:   Sepsis due to methicillin resistant Staphylococcus aureus (MRSA) (H[facility]) Active Problems:   Scrotal abscess   Obesity (BMI 30-39.9)   Uncontrolled type 2 diabetes mellitus with hyperglycemia (H[facility])   Lower extremity numbness   Tobacco abuse  Current Facility-Administered Medications  Medication Dose Route Frequency Provider Last Rate Last Admin  . diphenhydrAMINE (BENADRYL) injection 12.5-25 mg  12.5-25 mg Intravenous Q6H PRN [name], MD       Or  . diphenhydrAMINE (BENADRYL) 12.5 MG/5ML elixir 12.5-25 mg  12.5-25 mg Oral Q6H PRN [name], MD   25 mg at 06/11/19 0023  . docusate sodium (COLACE) capsule 100 mg  100 mg Oral BID [name], MD   100 mg at 06/12/19 0941  . doxycycline (VIBRA-TABS) tablet 100 mg  100 mg Oral Q12H [name], MD   100 mg at 06/13/19 0936  . enoxaparin (LOVENOX) injection 50 mg  0.5 mg/kg Subcutaneous Q24H [name], MD   50 mg at 06/12/19 0941  . insulin aspart (novoLOG) injection 0-5 Units  0-5 Units Subcutaneous QHS Hall, Carole N, DO      . insulin aspart (novoLOG) injection 0-9 Units  0-9 Units Subcutaneous TID WC Dow Adolph N, DO   3 Units at 06/12/19 1742  . insulin aspart (novoLOG) injection 4 Units  4 Units Subcutaneous TID WC Dow Adolph N, DO   4 Units at 06/13/19 1191  . insulin glargine (LANTUS) injection 10 Units  10 Units Subcutaneous Daily Darlin Drop, DO   10 Units at 06/13/19 4782  . metFORMIN (GLUCOPHAGE) tablet 500 mg  500 mg Oral BID WC Hall, Carole N, DO   500 mg at 06/13/19 0935  . morphine 2 MG/ML injection 2-4 mg  2-4 mg Intravenous Q2H PRN [name], MD   2 mg at 06/12/19 1142  . ondansetron (ZOFRAN) injection 4 mg  4 mg Intravenous Q4H PRN [name], MD      . oxyCODONE (Oxy IR/ROXICODONE) immediate release tablet 5  mg  5 mg Oral Q4H PRN [name], MD   5 mg at 06/13/19 9562     Objective: Vital: Vitals:   06/12/19 0506 06/12/19 1134 06/12/19 2128 06/13/19 0543  BP: 132/86 118/80 129/82 121/84  Pulse: 92 94 95 82  Resp: 20 18 16 16   Temp: 98.3 F (36.8 C) 98.5 F (36.9 C) 98 F (36.7 C) 97.9 F (36.6 C)  TempSrc: Oral Oral Oral Oral  SpO2: 95% 98% 96% 97%  Weight:      Height:       I/Os: I/O last 3 completed shifts: In: 600 [P.O.:600] Out: 750 [Urine:750]  Physical Exam:  General: Patient is in no apparent distress Lungs: Normal respiratory effort, chest expands symmetrically. GI: The abdomen is soft and nontender without mass.  GU: Right groin cellulitis markedly improved, packing changed, no purulent drainage, no secretions, scrotal cellulitis continues to improve Ext: lower extremities symmetric  Lab Results: Recent Labs    06/11/19 0343 06/12/19 0321 06/13/19 0345  WBC 21.4* 15.9* 12.8*  HGB 13.5 13.2 14.7  HCT 40.3 39.9 45.3   Recent Labs    06/11/19 0343 06/12/19 0321 06/13/19 0345  NA 136 137 141  K 4.1 3.3* 3.4*  CL 106 108 107  CO2 19* 19* 22  GLUCOSE  262* 215* 177*  BUN 12 15 15   CREATININE 0.83 0.69 0.72  CALCIUM 8.4* 8.4* 8.8*   No results for input(s): LABPT, INR in the last 72 hours. No results for input(s): LABURIN in the last 72 hours. Results for orders placed or performed during the hospital encounter of 06/09/19  Blood culture (routine x 2)     Status: None (Preliminary result)   Collection Time: 06/09/19 10:23 AM   Specimen: BLOOD  Result Value Ref Range Status   Specimen Description   Final    BLOOD RIGHT ANTECUBITAL Performed at [facility], [address], [place], [place]    Special Requests   Final    BOTTLES DRAWN AEROBIC AND ANAEROBIC Blood Culture adequate volume Performed at [facility], [address], [place], [place]    Culture   Final    NO GROWTH 3  DAYS Performed at [facility], [address] [place], [place]    Report Status PENDING  Incomplete  Blood culture (routine x 2)     Status: None (Preliminary result)   Collection Time: 06/09/19 10:23 AM   Specimen: BLOOD  Result Value Ref Range Status   Specimen Description   Final    BLOOD LEFT ANTECUBITAL Performed at [facility], [address] [place], [place]    Special Requests   Final    BOTTLES DRAWN AEROBIC AND ANAEROBIC Blood Culture adequate volume Performed at [facility], [address], [place], [place]    Culture   Final    NO GROWTH 3 DAYS Performed at [facility], [address], [place], [place]    Report Status PENDING  Incomplete  Respiratory Panel by RT PCR (Flu A&B, Covid) - Nasopharyngeal Swab     Status: None   Collection Time: 06/09/19  4:11 PM   Specimen: Nasopharyngeal Swab  Result Value Ref Range Status   SARS Coronavirus 2 by RT PCR NEGATIVE NEGATIVE Final    Comment: (NOTE) SARS-CoV-2 target nucleic acids are NOT DETECTED. The SARS-CoV-2 RNA is generally detectable in upper respiratoy specimens during the acute phase of infection. The lowest concentration of SARS-CoV-2 viral copies this assay can detect is 131 copies/mL. A negative result does not preclude SARS-Cov-2 infection and should not be used as the sole basis for treatment or other patient management decisions. A negative result may occur with  improper specimen collection/handling, submission of specimen other than nasopharyngeal swab, presence of viral mutation(s) within the areas targeted by this assay, and inadequate number of viral copies (<131 copies/mL). A negative result must be combined with clinical observations, patient history, and epidemiological information. The expected result is Negative. Fact Sheet for Patients:  06/11/19 Fact Sheet for  Healthcare Providers:  https://www.moore.com/ This test is not yet ap proved or cleared by the https://www.young.biz/ FDA and  has been authorized for detection and/or diagnosis of SARS-CoV-2 by FDA under an Emergency Use Authorization (EUA). This EUA will remain  in effect (meaning this test can be used) for the duration of the COVID-19 declaration under Section 564(b)(1) of the Act, 21 U.S.C. section 360bbb-3(b)(1), unless the authorization is terminated or revoked sooner.    Influenza A by PCR NEGATIVE NEGATIVE Final   Influenza B by PCR NEGATIVE NEGATIVE Final    Comment: (NOTE) The Xpert Xpress SARS-CoV-2/FLU/RSV assay is intended as an aid in  the diagnosis of influenza from Nasopharyngeal  swab specimens and  should not be used as a sole basis for treatment. Nasal washings and  aspirates are unacceptable for Xpert Xpress SARS-CoV-2/FLU/RSV  testing. Fact Sheet for Patients: PinkCheek.be Fact Sheet for Healthcare Providers: GravelBags.it This test is not yet approved or cleared by the [place] FDA and  has been authorized for detection and/or diagnosis of SARS-CoV-2 by  FDA under an Emergency Use Authorization (EUA). This EUA will remain  in effect (meaning this test can be used) for the duration of the  Covid-19 declaration under Section 564(b)(1) of the Act, 21  U.S.C. section 360bbb-3(b)(1), unless the authorization is  terminated or revoked. Performed at [facility], [place] [address] [place], [place] [address]   Aerobic Culture (superficial specimen)     Status: None   Collection Time: 06/09/19  4:11 PM   Specimen: Abscess  Result Value Ref Range Status   Specimen Description   Final    ABSCESS Performed at [facility] [address], [place], [place] [address]    Special Requests   Final    NONE Performed at [facility], [place]  [address], [place], [place] [address]    Gram Stain   Final    ABUNDANT WBC PRESENT, PREDOMINANTLY PMN ABUNDANT GRAM POSITIVE COCCI Performed at [facility], [place] [address] [place],  [address]    Culture   Final    ABUNDANT METHICILLIN RESISTANT STAPHYLOCOCCUS AUREUS   Report Status 06/11/2019 FINAL  Final   Organism ID, Bacteria METHICILLIN RESISTANT STAPHYLOCOCCUS AUREUS  Final      Susceptibility   Methicillin resistant staphylococcus aureus - MIC*    CIPROFLOXACIN <=0.5 SENSITIVE Sensitive     ERYTHROMYCIN >=8 RESISTANT Resistant     GENTAMICIN <=0.5 SENSITIVE Sensitive     OXACILLIN >=4 RESISTANT Resistant     TETRACYCLINE <=1 SENSITIVE Sensitive     VANCOMYCIN 1 SENSITIVE Sensitive     TRIMETH/SULFA <=10 SENSITIVE Sensitive     CLINDAMYCIN <=0.25 SENSITIVE Sensitive     RIFAMPIN <=0.5 SENSITIVE Sensitive     Inducible Clindamycin NEGATIVE Sensitive     * ABUNDANT METHICILLIN RESISTANT STAPHYLOCOCCUS AUREUS    Studies/Results: MR Lumbar Spine W Wo Contrast  Result Date: 06/12/2019 CLINICAL DATA:  Worsening lower extremity numbness after injuring back less than a year ago; lumbar radiculopathy, greater than 6 weeks. Additional history provided by scanning technologist: Patient reports numbness in toes and stinging pain down legs when sitting down, right groin pain for 2 days as well as redness, injury to back lifting mother. EXAM: MRI LUMBAR SPINE WITHOUT AND WITH CONTRAST TECHNIQUE: Multiplanar and multiecho pulse sequences of the lumbar spine were obtained without and with intravenous contrast. CONTRAST:  17mL GADAVIST GADOBUTROL 1 MMOL/ML IV SOLN COMPARISON:  CT pelvis 06/09/2018 FINDINGS: Segmentation: For the purposes of this dictation, five lumbar vertebrae are assumed and the caudal most well-formed intervertebral disc is designated L5-S1. Alignment: Straightening of the expected lumbar lordosis. 2 mm L4-L5 grade 1 retrolisthesis. Vertebrae: No compression  fracture. There is prominent degenerative irregularity of the L4 superior endplate anteriorly. There is mild enhancement at site of an L4 inferior endplate Schmorl node and this may be recent. Small Schmorl nodes more superiorly within the lumbar and visualized lower thoracic spine. No marrow edema. L3 vertebral body hemangioma. Conus medullaris and cauda equina: Conus extends to the L1 level. No signal abnormality within the visualized distal spinal cord. Paraspinal and other soft tissues: No abnormality identified  within included portions of the abdomen/retroperitoneum. Paraspinal soft tissues within normal limits. Disc levels: Mild disc degeneration greatest at L2-L3, L3-L4 and L4-L5. Congenitally narrow lumbar spinal canal. L1-L2: No disc herniation. No significant degenerative canal or foraminal stenosis. L2-L3: No disc herniation. No significant degenerative canal or foraminal stenosis. L3-L4: Small disc bulge with mild endplate spurring. Mild bilateral subarticular and central canal narrowing without nerve root impingement. No significant foraminal stenosis. L4-L5: Mild grade 1 retrolisthesis. Disc bulge with endplate spurring. Superimposed broad-based left subarticular/foraminal disc protrusion. Mild facet hypertrophy. Mild central canal and left subarticular narrowing. Slight crowding of the descending left L5 nerve root without nerve root impingement. Mild/moderate left neural foraminal narrowing. L5-S1: No disc herniation. No significant degenerative canal or foraminal stenosis. 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 Findings are most notably as follows. Somewhat prominent degenerative irregularity of the L4 superior endplate anteriorly. Multilevel small Schmorl nodes. There is mild enhancement associated with and L4 inferior endplate Schmorl node and this may be recent. At L4-L5, disc bulge with endplate spurring. Superimposed broad-based left  subarticular/foraminal disc protrusion. Mild left subarticular and central canal narrowing. Slight crowding of the descending left L5 nerve root without nerve root impingement. Mild/moderate left neural foraminal narrowing. At L3-L4, small disc bulge with endplate spurring. Mild facet hypertrophy. Mild bilateral subarticular and central canal narrowing without nerve root impingement. Electronically Signed   By: Jackey Loge DO   On: 06/12/2019 17:59    Assessment/Plan: 39 yo man with scrotal abscess and cellulitis and sepsis PPD4 s/p I&D scrotal absccess. Wound culture shows MRSA.  Patient also with newly diagnosed DM.  Leukocytosis continues to improve.    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

## 2019-06-13 NOTE — Discharge Instructions (Signed)
Fat and Cholesterol Restricted Eating Plan Getting too much fat and cholesterol in your diet may cause health problems. Choosing the right foods helps keep your fat and cholesterol at normal levels. This can keep you from getting certain diseases. Your doctor may recommend an eating plan that includes:  Total fat: ______% or less of total calories a day.  Saturated fat: ______% or less of total calories a day.  Cholesterol: less than _________mg a day.  Fiber: ______g a day. What are tips for following this plan? Meal planning  At meals, divide your plate into four equal parts: ? Fill one-half of your plate with vegetables and green salads. ? Fill one-fourth of your plate with whole grains. ? Fill one-fourth of your plate with low-fat (lean) protein foods.  Eat fish that is high in omega-3 fats at least two times a week. This includes mackerel, tuna, sardines, and salmon.  Eat foods that are high in fiber, such as whole grains, beans, apples, broccoli, carrots, peas, and barley. General tips   Work with your doctor to lose weight if you need to.  Avoid: ? Foods with added sugar. ? Fried foods. ? Foods with partially hydrogenated oils.  Limit alcohol intake to no more than 1 drink a day for nonpregnant women and 2 drinks a day for men. One drink equals 12 oz of beer, 5 oz of wine, or 1 oz of hard liquor. Reading food labels  Check food labels for: ? Trans fats. ? Partially hydrogenated oils. ? Saturated fat (g) in each serving. ? Cholesterol (mg) in each serving. ? Fiber (g) in each serving.  Choose foods with healthy fats, such as: ? Monounsaturated fats. ? Polyunsaturated fats. ? Omega-3 fats.  Choose grain products that have whole grains. Look for the word "whole" as the first word in the ingredient list. Cooking  Cook foods using low-fat methods. These include baking, boiling, grilling, and broiling.  Eat more home-cooked foods. Eat at restaurants and buffets  less often.  Avoid cooking using saturated fats, such as butter, cream, palm oil, palm kernel oil, and coconut oil. Recommended foods  Fruits  All fresh, canned (in natural juice), or frozen fruits. Vegetables  Fresh or frozen vegetables (raw, steamed, roasted, or grilled). Green salads. Grains  Whole grains, such as whole wheat or whole grain breads, crackers, cereals, and pasta. Unsweetened oatmeal, bulgur, barley, quinoa, or brown rice. Corn or whole wheat flour tortillas. Meats and other protein foods  Ground beef (85% or leaner), grass-fed beef, or beef trimmed of fat. Skinless chicken or turkey. Ground chicken or turkey. Pork trimmed of fat. All fish and seafood. Egg whites. Dried beans, peas, or lentils. Unsalted nuts or seeds. Unsalted canned beans. Nut butters without added sugar or oil. Dairy  Low-fat or nonfat dairy products, such as skim or 1% milk, 2% or reduced-fat cheeses, low-fat and fat-free ricotta or cottage cheese, or plain low-fat and nonfat yogurt. Fats and oils  Tub margarine without trans fats. Light or reduced-fat mayonnaise and salad dressings. Avocado. Olive, canola, sesame, or safflower oils. The items listed above may not be a complete list of foods and beverages you can eat. Contact a dietitian for more information. Foods to avoid Fruits  Canned fruit in heavy syrup. Fruit in cream or butter sauce. Fried fruit. Vegetables  Vegetables cooked in cheese, cream, or butter sauce. Fried vegetables. Grains  White bread. White pasta. White rice. Cornbread. Bagels, pastries, and croissants. Crackers and snack foods that contain trans fat   and hydrogenated oils. Meats and other protein foods  Fatty cuts of meat. Ribs, chicken wings, bacon, sausage, bologna, salami, chitterlings, fatback, hot dogs, bratwurst, and packaged lunch meats. Liver and organ meats. Whole eggs and egg yolks. Chicken and [place] with skin. Fried meat. Dairy  Whole or 2% milk, cream,  half-and-half, and cream cheese. Whole milk cheeses. Whole-fat or sweetened yogurt. Full-fat cheeses. Nondairy creamers and whipped toppings. Processed cheese, cheese spreads, and cheese curds. Beverages  Alcohol. Sugar-sweetened drinks such as sodas, lemonade, and fruit drinks. Fats and oils  Butter, stick margarine, lard, shortening, ghee, or bacon fat. Coconut, palm kernel, and palm oils. Sweets and desserts  Corn syrup, sugars, honey, and molasses. Candy. Jam and jelly. Syrup. Sweetened cereals. Cookies, pies, cakes, donuts, muffins, and ice cream. The items listed above may not be a complete list of foods and beverages you should avoid. Contact a dietitian for more information. Summary  Choosing the right foods helps keep your fat and cholesterol at normal levels. This can keep you from getting certain diseases.  At meals, fill one-half of your plate with vegetables and green salads.  Eat high-fiber foods, like whole grains, beans, apples, carrots, peas, and barley.  Limit added sugar, saturated fats, alcohol, and fried foods. This information is not intended to replace advice given to you by your health care provider. Make sure you discuss any questions you have with your health care provider. Document Revised: 10/31/2017 Document Reviewed: 11/14/2016 Elsevier Patient Education  2020 Elsevier Inc.   Urology Follow Up -daily packing changes -doxycycline 100mg  BID until completion -percocet 1 tab every 6 hours as needed for pain -patient may shower as needed with packing in place  Diabetes Reminders Check your glucose 4 times a day before the meals and at bedtime Goal glucose is 150 average all the time first thing in the morning (fasting blood sugar) between 80-130 Take your insulin as prescribed Lantus (long acting/basal insulin) given once a day and lasts 24 hours Novolog short acting insulin to cover meal intake take before the meal Metformin take with food to lower  potential side effects of the medication Low blood sugar is less than 70 (silly, sweaty, shaky) Consumes 4 oz regular juice/or soda/or chew 4 glucose tablets wait 15 minutes do it again if symptoms continue

## 2019-06-13 NOTE — Progress Notes (Signed)
Consult PROGRESS NOTE  Jordan Black ZYY:[id number] DOB: 03-30-1980 DOA: 06/09/2019 PCP: Patient, No Pcp Per  Requesting MD: Urology, Dr. Claudia Desanctis  Reason for consult: Diabetes management with new diagnosis   HPI/Recap of past 24 hours: Jordan Black is an 39 y.o. male with past medical history of obesity who presented to the emergency room with complaints of right groin pain x2 days as well as redness.  At time of presentation, wound was actively draining.  Patient evaluated and found to have an abscess of his R scrotum.  Seen by urology and patient underwent I&D and admitted to the urology service.  Cultures returned positive for MRSA.  At time of admission, patient noted to have elevated blood sugars as high as the 400s, although no evidence of DKA.  Started on sliding scale, sugar staying elevated, hospitalist were called for medical management and consultation.    06/13/19: No new complaints this morning.  CBGs stable.  Will send prescription to his pharmacy as he requested: Lantus 10 U daily, novolog 4U TID and metformin 500 mg BID.  Will need to follow-up with his PCP post hospitalization.     Assessment/Plan: Principal Problem:   Sepsis due to methicillin resistant Staphylococcus aureus (MRSA) ([place]) Active Problems:   Scrotal abscess   Obesity (BMI 30-39.9)   Uncontrolled type 2 diabetes mellitus with hyperglycemia (HCC)   Lower extremity numbness   Tobacco abuse   Newly diagnosed type 2 diabetes with hyperglycemia A1C 11.9 Continue: Lantus 10 U daily novolog 4U TID Metformin 500 mg BID Follow-up with your PCP in 1 week  Hyperlipidemia LDL 123 and HDL low 15 Recommend lifestyle modification initially with low cholesterol diet, and weight loss  Follow-up with your PCP  Hypokalemia K+ 3.3>> 3.4 Magnesium 2.0 Repleted Take 20 mEq daily x3 days Then follow-up with your PCP with a repeat BMP next Friday    Thank you for this consultation.  Our [facility] hospitalist team  signing off.  Objective: Vitals:   06/12/19 0506 06/12/19 1134 06/12/19 2128 06/13/19 0543  BP: 132/86 118/80 129/82 121/84  Pulse: 92 94 95 82  Resp: 20 18 16 16   Temp: 98.3 F (36.8 C) 98.5 F (36.9 C) 98 F (36.7 C) 97.9 F (36.6 C)  TempSrc: Oral Oral Oral Oral  SpO2: 95% 98% 96% 97%  Weight:      Height:        Intake/Output Summary (Last 24 hours) at 06/13/2019 0940 Last data filed at 06/13/2019 0810 Gross per 24 hour  Intake 240 ml  Output 700 ml  Net -460 ml   Filed Weights   06/09/19 0931  Weight: 99.8 kg    Exam: No changes from prior exam.  . General: 39 y.o. year-old male well developed well nourished in no acute distress.  Alert and oriented x3. . Cardiovascular: Regular rate and rhythm with no rubs or gallops.  No thyromegaly or JVD noted.   Marland Kitchen Respiratory: Clear to auscultation with no wheezes or rales. Good inspiratory effort. . Abdomen: Soft nontender nondistended with normal bowel sounds x4 quadrants. Erythema and tenderness R scrotum. . Musculoskeletal: No lower extremity edema. 2/4 pulses in all 4 extremities. Marland Kitchen Psychiatry: Mood is appropriate for condition and setting   Data Reviewed: CBC: Recent Labs  Lab 06/09/19 0942 06/10/19 0427 06/11/19 0343 06/12/19 0321 06/13/19 0345  WBC 22.7* 25.2* 21.4* 15.9* 12.8*  NEUTROABS 19.3*  --   --   --   --   HGB 15.6 13.8  13.5 13.2 14.7  HCT 46.4 41.5 40.3 39.9 45.3  MCV 88.0 90.0 91.4 90.7 90.2  PLT 234 234 229 253 308   Basic Metabolic Panel: Recent Labs  Lab 06/09/19 0942 06/10/19 0427 06/11/19 0343 06/12/19 0321 06/13/19 0345  NA 134* 134* 136 137 141  K 3.7 3.7 4.1 3.3* 3.4*  CL 98 104 106 108 107  CO2 20* 16* 19* 19* 22  GLUCOSE 433* 295* 262* 215* 177*  BUN 11 7 12 15 15   CREATININE 0.89 0.77 0.83 0.69 0.72  CALCIUM 8.7* 8.3* 8.4* 8.4* 8.8*  MG  --   --   --  2.0  --    GFR: Estimated Creatinine Clearance: 148.6 mL/min (by C-G formula based on SCr of 0.72 mg/dL). Liver Function  Tests: Recent Labs  Lab 06/09/19 0942  AST 24  ALT 33  ALKPHOS 99  BILITOT 1.7*  PROT 7.7  ALBUMIN 3.6   No results for input(s): LIPASE, AMYLASE in the last 168 hours. No results for input(s): AMMONIA in the last 168 hours. Coagulation Profile: No results for input(s): INR, PROTIME in the last 168 hours. Cardiac Enzymes: No results for input(s): CKTOTAL, CKMB, CKMBINDEX, TROPONINI in the last 168 hours. BNP (last 3 results) No results for input(s): PROBNP in the last 8760 hours. HbA1C: Recent Labs    06/11/19 0343  HGBA1C 11.9*   CBG: Recent Labs  Lab 06/12/19 1131 06/12/19 1542 06/12/19 1740 06/12/19 2124 06/13/19 0812  GLUCAP 234* 241* 223* 200* 185*   Lipid Profile: Recent Labs    06/12/19 0321  CHOL 159  HDL 15*  LDLCALC 123*  TRIG 103  CHOLHDL 10.6   Thyroid Function Tests: No results for input(s): TSH, T4TOTAL, FREET4, T3FREE, THYROIDAB in the last 72 hours. Anemia Panel: No results for input(s): VITAMINB12, FOLATE, FERRITIN, TIBC, IRON, RETICCTPCT in the last 72 hours. Urine analysis:    Component Value Date/Time   COLORURINE YELLOW 06/09/2019 0942   APPEARANCEUR CLEAR 06/09/2019 0942   LABSPEC 1.038 (H) 06/09/2019 0942   PHURINE 6.0 06/09/2019 0942   GLUCOSEU >=500 (A) 06/09/2019 0942   HGBUR NEGATIVE 06/09/2019 0942   BILIRUBINUR NEGATIVE 06/09/2019 0942   KETONESUR 80 (A) 06/09/2019 0942   PROTEINUR 30 (A) 06/09/2019 0942   NITRITE NEGATIVE 06/09/2019 0942   LEUKOCYTESUR NEGATIVE 06/09/2019 0942   Sepsis Labs: @LABRCNTIP (procalcitonin:4,lacticidven:4)  ) Recent Results (from the past 240 hour(s))  Blood culture (routine x 2)     Status: None (Preliminary result)   Collection Time: 06/09/19 10:23 AM   Specimen: BLOOD  Result Value Ref Range Status   Specimen Description   Final    BLOOD RIGHT ANTECUBITAL Performed at [facility], [address] [place], [place] [address]    Special Requests   Final    BOTTLES  DRAWN AEROBIC AND ANAEROBIC Blood Culture adequate volume Performed at [facility], [address] [place], [place] [address]    Culture   Final    NO GROWTH 3 DAYS Performed at [facility], [address] [place], [place] [address]    Report Status PENDING  Incomplete  Blood culture (routine x 2)     Status: None (Preliminary result)   Collection Time: 06/09/19 10:23 AM   Specimen: BLOOD  Result Value Ref Range Status   Specimen Description   Final    BLOOD LEFT ANTECUBITAL Performed at [facility], [address] [place], [place] [address]    Special  Requests   Final    BOTTLES DRAWN AEROBIC AND ANAEROBIC Blood Culture adequate volume Performed at [facility], [address], [place], [place] [address]    Culture   Final    NO GROWTH 3 DAYS Performed at [facility], [address], [place], [place] [address]    Report Status PENDING  Incomplete  Respiratory Panel by RT PCR (Flu A&B, Covid) - Nasopharyngeal Swab     Status: None   Collection Time: 06/09/19  4:11 PM   Specimen: Nasopharyngeal Swab  Result Value Ref Range Status   SARS Coronavirus 2 by RT PCR NEGATIVE NEGATIVE Final    Comment: (NOTE) SARS-CoV-2 target nucleic acids are NOT DETECTED. The SARS-CoV-2 RNA is generally detectable in upper respiratoy specimens during the acute phase of infection. The lowest concentration of SARS-CoV-2 viral copies this assay can detect is 131 copies/mL. A negative result does not preclude SARS-Cov-2 infection and should not be used as the sole basis for treatment or other patient management decisions. A negative result may occur with  improper specimen collection/handling, submission of specimen other than nasopharyngeal swab, presence of viral mutation(s) within the areas targeted by this assay, and inadequate number of viral copies (<131 copies/mL). A negative result must be combined with  clinical observations, patient history, and epidemiological information. The expected result is Negative. Fact Sheet for Patients:  https://www.moore.com/ Fact Sheet for Healthcare Providers:  https://www.young.biz/ This test is not yet ap proved or cleared by the [place] FDA and  has been authorized for detection and/or diagnosis of SARS-CoV-2 by FDA under an Emergency Use Authorization (EUA). This EUA will remain  in effect (meaning this test can be used) for the duration of the COVID-19 declaration under Section 564(b)(1) of the Act, 21 U.S.C. section 360bbb-3(b)(1), unless the authorization is terminated or revoked sooner.    Influenza A by PCR NEGATIVE NEGATIVE Final   Influenza B by PCR NEGATIVE NEGATIVE Final    Comment: (NOTE) The Xpert Xpress SARS-CoV-2/FLU/RSV assay is intended as an aid in  the diagnosis of influenza from Nasopharyngeal swab specimens and  should not be used as a sole basis for treatment. Nasal washings and  aspirates are unacceptable for Xpert Xpress SARS-CoV-2/FLU/RSV  testing. Fact Sheet for Patients: https://www.moore.com/ Fact Sheet for Healthcare Providers: https://www.young.biz/ This test is not yet approved or cleared by the [place] FDA and  has been authorized for detection and/or diagnosis of SARS-CoV-2 by  FDA under an Emergency Use Authorization (EUA). This EUA will remain  in effect (meaning this test can be used) for the duration of the  Covid-19 declaration under Section 564(b)(1) of the Act, 21  U.S.C. section 360bbb-3(b)(1), unless the authorization is  terminated or revoked. Performed at [facility], [address], [place], [place] [address]   Aerobic Culture (superficial specimen)     Status: None   Collection Time: 06/09/19  4:11 PM   Specimen: Abscess  Result Value Ref Range Status   Specimen Description   Final     ABSCESS Performed at [facility], [address] [place], [place] [address]    Special Requests   Final    NONE Performed at [facility], [address], [place], [place] [address]    Gram Stain   Final    ABUNDANT WBC PRESENT, PREDOMINANTLY PMN ABUNDANT GRAM POSITIVE COCCI Performed at [facility], [address] [place], [place] [address]  Culture   Final    ABUNDANT METHICILLIN RESISTANT STAPHYLOCOCCUS AUREUS   Report Status 06/11/2019 FINAL  Final   Organism ID, Bacteria METHICILLIN RESISTANT STAPHYLOCOCCUS AUREUS  Final      Susceptibility   Methicillin resistant staphylococcus aureus - MIC*    CIPROFLOXACIN <=0.5 SENSITIVE Sensitive     ERYTHROMYCIN >=8 RESISTANT Resistant     GENTAMICIN <=0.5 SENSITIVE Sensitive     OXACILLIN >=4 RESISTANT Resistant     TETRACYCLINE <=1 SENSITIVE Sensitive     VANCOMYCIN 1 SENSITIVE Sensitive     TRIMETH/SULFA <=10 SENSITIVE Sensitive     CLINDAMYCIN <=0.25 SENSITIVE Sensitive     RIFAMPIN <=0.5 SENSITIVE Sensitive     Inducible Clindamycin NEGATIVE Sensitive     * ABUNDANT METHICILLIN RESISTANT STAPHYLOCOCCUS AUREUS      Studies: MR Lumbar Spine W Wo Contrast  Result Date: 06/12/2019 CLINICAL DATA:  Worsening lower extremity numbness after injuring back less than a year ago; lumbar radiculopathy, greater than 6 weeks. Additional history provided by scanning technologist: Patient reports numbness in toes and stinging pain down legs when sitting down, right groin pain for 2 days as well as redness, injury to back lifting mother. EXAM: MRI LUMBAR SPINE WITHOUT AND WITH CONTRAST TECHNIQUE: Multiplanar and multiecho pulse sequences of the lumbar spine were obtained without and with intravenous contrast. CONTRAST:  37mL GADAVIST GADOBUTROL 1 MMOL/ML IV SOLN COMPARISON:  CT pelvis 06/09/2018 FINDINGS: Segmentation: For the purposes of this dictation, five lumbar vertebrae are assumed and the caudal  most well-formed intervertebral disc is designated L5-S1. Alignment: Straightening of the expected lumbar lordosis. 2 mm L4-L5 grade 1 retrolisthesis. Vertebrae: No compression fracture. There is prominent degenerative irregularity of the L4 superior endplate anteriorly. There is mild enhancement at site of an L4 inferior endplate Schmorl node and this may be recent. Small Schmorl nodes more superiorly within the lumbar and visualized lower thoracic spine. No marrow edema. L3 vertebral body hemangioma. Conus medullaris and cauda equina: Conus extends to the L1 level. No signal abnormality within the visualized distal spinal cord. Paraspinal and other soft tissues: No abnormality identified within included portions of the abdomen/retroperitoneum. Paraspinal soft tissues within normal limits. Disc levels: Mild disc degeneration greatest at L2-L3, L3-L4 and L4-L5. Congenitally narrow lumbar spinal canal. L1-L2: No disc herniation. No significant degenerative canal or foraminal stenosis. L2-L3: No disc herniation. No significant degenerative canal or foraminal stenosis. L3-L4: Small disc bulge with mild endplate spurring. Mild bilateral subarticular and central canal narrowing without nerve root impingement. No significant foraminal stenosis. L4-L5: Mild grade 1 retrolisthesis. Disc bulge with endplate spurring. Superimposed broad-based left subarticular/foraminal disc protrusion. Mild facet hypertrophy. Mild central canal and left subarticular narrowing. Slight crowding of the descending left L5 nerve root without nerve root impingement. Mild/moderate left neural foraminal narrowing. L5-S1: No disc herniation. No significant degenerative canal or foraminal stenosis. 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 Findings are most notably as follows. Somewhat prominent degenerative irregularity of the L4 superior endplate anteriorly. Multilevel small Schmorl nodes. There is  mild enhancement associated with and L4 inferior endplate Schmorl node and this may be recent. At L4-L5, disc bulge with endplate spurring. Superimposed broad-based left subarticular/foraminal disc protrusion. Mild left subarticular and central canal narrowing. Slight crowding of the descending left L5 nerve root without nerve root impingement. Mild/moderate left neural foraminal narrowing. At L3-L4, small disc bulge with endplate spurring. Mild facet hypertrophy. Mild bilateral subarticular and central canal narrowing without nerve root impingement. Electronically  Signed   By: Jackey Loge DO   On: 06/12/2019 17:59    Scheduled Meds: . docusate sodium  100 mg Oral BID  . doxycycline  100 mg Oral Q12H  . enoxaparin (LOVENOX) injection  0.5 mg/kg Subcutaneous Q24H  . insulin aspart  0-5 Units Subcutaneous QHS  . insulin aspart  0-9 Units Subcutaneous TID WC  . insulin aspart  4 Units Subcutaneous TID WC  . insulin glargine  10 Units Subcutaneous Daily  . metFORMIN  500 mg Oral BID WC    Continuous Infusions:    LOS: 3 days     Darlin Drop, MD [facility] Pager [phone]  If 7PM-7AM, please contact night-coverage [email or web address] Password [email or web address] 06/13/2019, 9:40 AM

## 2019-06-14 LAB — CULTURE, BLOOD (ROUTINE X 2)
Culture: NO GROWTH
Special Requests: ADEQUATE

## 2019-06-14 LAB — GLUCOSE, CAPILLARY
Glucose-Capillary: 218 mg/dL — ABNORMAL HIGH (ref 70–99)
Glucose-Capillary: 191 mg/dL — ABNORMAL HIGH (ref 70–99)

## 2019-06-14 MED ORDER — DOXYCYCLINE HYCLATE 100 MG PO TABS: 100.0000 mg | ORAL_TABLET | Freq: Two times a day (BID) | ORAL | Status: AC

## 2019-06-14 MED ORDER — DOXYCYCLINE MONOHYDRATE 100 MG PO TABS: 100.0000 mg | ORAL_TABLET | Freq: Two times a day (BID) | ORAL | 0 refills | Status: AC

## 2019-06-14 NOTE — TOC Initial Note (Signed)
Transition of Care [facility]) - Initial/Assessment Note    Patient Details  Name: Jordan Black MRN: [id number] Date of Birth: Feb 15, 1981  Transition of Care [facility]) CM/SW Contact:    Darleene Cleaver, LCSW Phone Number: 06/14/2019, 2:10 PM  Clinical Narrative:                  CSW was informed by physician that patient will need daily wound care dressings and a home health nurse.  CSW informed attending physician that because patient has Medicaid, CSW will not be able to find home health nurse to see him.  Physician informed CSW that she will have him come to the office on Monday for packing change.  Expected Discharge Plan: Home/Self Care Barriers to Discharge: Continued Medical Work up   Patient Goals and CMS Choice Patient states their goals for this hospitalization and ongoing recovery are:: To return back home CMS Medicare.gov Compare Post Acute Care list provided to:: Patient Choice offered to / list presented to : Patient  Expected Discharge Plan and Services Expected Discharge Plan: Home/Self Care       Living arrangements for the past 2 months: Single Family Home Expected Discharge Date: 06/14/19                         [facility] Arranged: NA          Prior Living Arrangements/Services Living arrangements for the past 2 months: Single Family Home Lives with:: Spouse Patient language and need for interpreter reviewed:: Yes Do you feel safe going back to the place where you live?: Yes      Need for Family Participation in Patient Care: No (Comment) Care giver support system in place?: No (comment)   Criminal Activity/Legal Involvement Pertinent to Current Situation/Hospitalization: No - Comment as needed  Activities of Daily Living Home Assistive Devices/Equipment: None ADL Screening (condition at time of admission) Patient's cognitive ability adequate to safely complete daily activities?: No Is the patient deaf or have difficulty hearing?: No Does the patient have  difficulty seeing, even when wearing glasses/contacts?: No Does the patient have difficulty concentrating, remembering, or making decisions?: No Patient able to express need for assistance with ADLs?: Yes Does the patient have difficulty dressing or bathing?: No Independently performs ADLs?: Yes (appropriate for developmental age) Does the patient have difficulty walking or climbing stairs?: No Weakness of Legs: None Weakness of Arms/Hands: None  Permission Sought/Granted Permission sought to share information with : Family Supports Permission granted to share information with : Yes, Verbal Permission Granted  Share Information with NAME: Clemmie, Marxen [phone]  [phone]           Emotional Assessment Appearance:: Appears stated age   Affect (typically observed): Accepting, Calm Orientation: : Oriented to Self, Oriented to Place, Oriented to  Time, Oriented to Situation Alcohol / Substance Use: Not Applicable Psych Involvement: No (comment)  Admission diagnosis:  Scrotal abscess [N49.2] Patient Active Problem List   Diagnosis Date Noted  . Obesity (BMI 30-39.9) 06/11/2019  . Uncontrolled type 2 diabetes mellitus with hyperglycemia (HCC) 06/11/2019  . Sepsis due to methicillin resistant Staphylococcus aureus (MRSA) (HCC) 06/11/2019  . Lower extremity numbness 06/11/2019  . Tobacco abuse 06/11/2019  . Scrotal abscess 06/09/2019   PCP:  Patient, No Pcp Per Pharmacy:   [id number] - [place], [place] - [address] [place] [address] Phone: [phone] Fax: [phone]  Social Determinants of Health (SDOH) Interventions    Readmission Risk Interventions No flowsheet data found.

## 2019-06-14 NOTE — Progress Notes (Signed)
DC instructions provided and explained to patient in detail. Diabetic and insulin administration teaching provided to patient and patient's spouse. Patient verbalizes understanding; patient's spouse confirms that Diabetes kit/supplies picked up at pharmacy. Patient escorted to lobby in wheel chair to be discharged home with spouse.

## 2019-06-16 LAB — CULTURE, BLOOD (ROUTINE X 2)
Culture: NO GROWTH
Special Requests: ADEQUATE
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
## 2019-07-03 ENCOUNTER — Ambulatory Visit: Payer: Medicaid Other | Admitting: Family Medicine
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
